# Patient Record
Sex: Male | Born: 1975 | Race: Black or African American | Hispanic: No | Marital: Single | Smoking: Current every day smoker
Health system: Southern US, Community
[De-identification: ages and names within clinical notes are randomized; demographics above are authoritative.]

## PROBLEM LIST (undated history)

## (undated) DIAGNOSIS — I10 Essential (primary) hypertension: Secondary | ICD-10-CM

---

## 1998-04-02 ENCOUNTER — Emergency Department (HOSPITAL_COMMUNITY): Admission: EM | Admit: 1998-04-02 | Discharge: 1998-04-02 | Payer: Self-pay | Admitting: Emergency Medicine

## 1998-04-04 ENCOUNTER — Emergency Department (HOSPITAL_COMMUNITY): Admission: EM | Admit: 1998-04-04 | Discharge: 1998-04-04 | Payer: Self-pay | Admitting: Emergency Medicine

## 1998-04-21 ENCOUNTER — Encounter: Payer: Self-pay | Admitting: Emergency Medicine

## 1998-04-21 ENCOUNTER — Emergency Department (HOSPITAL_COMMUNITY): Admission: EM | Admit: 1998-04-21 | Discharge: 1998-04-21 | Payer: Self-pay | Admitting: Emergency Medicine

## 2004-02-10 ENCOUNTER — Emergency Department (HOSPITAL_COMMUNITY): Admission: EM | Admit: 2004-02-10 | Discharge: 2004-02-10 | Payer: Self-pay | Admitting: Emergency Medicine

## 2006-07-21 ENCOUNTER — Emergency Department (HOSPITAL_COMMUNITY): Admission: EM | Admit: 2006-07-21 | Discharge: 2006-07-21 | Payer: Self-pay | Admitting: *Deleted

## 2010-03-21 ENCOUNTER — Emergency Department (HOSPITAL_COMMUNITY): Payer: Self-pay

## 2010-03-21 ENCOUNTER — Emergency Department (HOSPITAL_COMMUNITY)
Admission: EM | Admit: 2010-03-21 | Discharge: 2010-03-21 | Disposition: A | Discharge status: Home / Self Care | Payer: Self-pay | Attending: Emergency Medicine | Admitting: Emergency Medicine

## 2010-03-21 DIAGNOSIS — R1013 Epigastric pain: Secondary | ICD-10-CM | POA: Insufficient documentation

## 2010-03-21 DIAGNOSIS — IMO0002 Reserved for concepts with insufficient information to code with codable children: Secondary | ICD-10-CM | POA: Insufficient documentation

## 2010-03-21 DIAGNOSIS — R0789 Other chest pain: Secondary | ICD-10-CM | POA: Insufficient documentation

## 2010-03-21 DIAGNOSIS — X58XXXA Exposure to other specified factors, initial encounter: Secondary | ICD-10-CM | POA: Insufficient documentation

## 2010-03-21 DIAGNOSIS — R12 Heartburn: Secondary | ICD-10-CM | POA: Insufficient documentation

## 2010-03-21 LAB — CBC
HCT: 43.5 % (ref 39.0–52.0)
Hemoglobin: 14.8 g/dL (ref 13.0–17.0)
MCH: 25.1 pg — ABNORMAL LOW (ref 26.0–34.0)
MCHC: 34 g/dL (ref 30.0–36.0)
MCV: 73.7 fL — ABNORMAL LOW (ref 78.0–100.0)
Platelets: 237 10*3/uL (ref 150–400)
RBC: 5.9 MIL/uL — ABNORMAL HIGH (ref 4.22–5.81)
RDW: 14.8 % (ref 11.5–15.5)
WBC: 10 10*3/uL (ref 4.0–10.5)

## 2010-03-21 LAB — DIFFERENTIAL
Basophils Absolute: 0.1 10*3/uL (ref 0.0–0.1)
Basophils Relative: 1 % (ref 0–1)
Eosinophils Absolute: 0.3 10*3/uL (ref 0.0–0.7)
Eosinophils Relative: 3 % (ref 0–5)
Lymphocytes Relative: 47 % — ABNORMAL HIGH (ref 12–46)
Lymphs Abs: 4.7 10*3/uL — ABNORMAL HIGH (ref 0.7–4.0)
Monocytes Absolute: 0.9 10*3/uL (ref 0.1–1.0)
Monocytes Relative: 9 % (ref 3–12)
Neutro Abs: 4 10*3/uL (ref 1.7–7.7)
Neutrophils Relative %: 40 % — ABNORMAL LOW (ref 43–77)

## 2010-03-21 LAB — CK TOTAL AND CKMB (NOT AT ARMC)
CK, MB: 1.5 ng/mL (ref 0.3–4.0)
Total CK: 183 U/L (ref 7–232)

## 2010-03-21 LAB — COMPREHENSIVE METABOLIC PANEL
ALT: 31 U/L (ref 0–53)
AST: 21 U/L (ref 0–37)
Calcium: 9.5 mg/dL (ref 8.4–10.5)
GFR calc Af Amer: >60 mL/min (ref >60)
Glucose, Bld: 90 mg/dL (ref 70–99)
Sodium: 140 mEq/L (ref 135–145)
Total Protein: 7.2 g/dL (ref 6.0–8.3)

## 2010-03-21 LAB — LIPASE, BLOOD: Lipase: 28 U/L (ref 11–59)

## 2011-03-09 ENCOUNTER — Encounter (HOSPITAL_COMMUNITY): Payer: Self-pay | Admitting: Emergency Medicine

## 2011-03-09 ENCOUNTER — Emergency Department (HOSPITAL_COMMUNITY)
Admission: EM | Admit: 2011-03-09 | Discharge: 2011-03-09 | Disposition: A | Discharge status: Home / Self Care | Payer: No Typology Code available for payment source | Attending: Emergency Medicine | Admitting: Emergency Medicine

## 2011-03-09 ENCOUNTER — Emergency Department (HOSPITAL_COMMUNITY): Payer: Self-pay

## 2011-03-09 DIAGNOSIS — S335XXA Sprain of ligaments of lumbar spine, initial encounter: Secondary | ICD-10-CM | POA: Insufficient documentation

## 2011-03-09 DIAGNOSIS — M5380 Other specified dorsopathies, site unspecified: Secondary | ICD-10-CM | POA: Insufficient documentation

## 2011-03-09 DIAGNOSIS — S161XXA Strain of muscle, fascia and tendon at neck level, initial encounter: Secondary | ICD-10-CM

## 2011-03-09 DIAGNOSIS — S139XXA Sprain of joints and ligaments of unspecified parts of neck, initial encounter: Secondary | ICD-10-CM | POA: Insufficient documentation

## 2011-03-09 DIAGNOSIS — M545 Low back pain, unspecified: Secondary | ICD-10-CM | POA: Insufficient documentation

## 2011-03-09 DIAGNOSIS — S39012A Strain of muscle, fascia and tendon of lower back, initial encounter: Secondary | ICD-10-CM

## 2011-03-09 DIAGNOSIS — M542 Cervicalgia: Secondary | ICD-10-CM | POA: Insufficient documentation

## 2011-03-09 DIAGNOSIS — M539 Dorsopathy, unspecified: Secondary | ICD-10-CM | POA: Insufficient documentation

## 2011-03-09 DIAGNOSIS — F17200 Nicotine dependence, unspecified, uncomplicated: Secondary | ICD-10-CM | POA: Insufficient documentation

## 2011-03-09 MED ORDER — IBUPROFEN 800 MG PO TABS: 800.0000 mg | ORAL_TABLET | Freq: Three times a day (TID) | ORAL | Status: AC

## 2011-03-09 MED ORDER — HYDROCODONE-ACETAMINOPHEN 5-325 MG PO TABS: 1.0000 | ORAL_TABLET | Freq: Four times a day (QID) | ORAL | Status: AC | PRN

## 2011-03-09 MED ORDER — CYCLOBENZAPRINE HCL 10 MG PO TABS: 10.0000 mg | ORAL_TABLET | Freq: Three times a day (TID) | ORAL | Status: AC | PRN
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
## 2011-03-10 NOTE — ED Provider Notes (Signed)
Medical screening examination/treatment/procedure(s) were performed by non-physician practitioner and as supervising physician I was immediately available for consultation/collaboration.    Yarissa Reining L Kenyotta Dorfman, MD 03/10/11 0855 

## 2012-09-07 ENCOUNTER — Encounter (HOSPITAL_COMMUNITY): Payer: Self-pay | Admitting: Emergency Medicine

## 2012-09-07 ENCOUNTER — Emergency Department (HOSPITAL_COMMUNITY)
Admission: EM | Admit: 2012-09-07 | Discharge: 2012-09-07 | Disposition: A | Discharge status: Home / Self Care | Payer: Self-pay | Attending: Emergency Medicine | Admitting: Emergency Medicine

## 2012-09-07 DIAGNOSIS — Y9389 Activity, other specified: Secondary | ICD-10-CM | POA: Insufficient documentation

## 2012-09-07 DIAGNOSIS — S139XXA Sprain of joints and ligaments of unspecified parts of neck, initial encounter: Secondary | ICD-10-CM | POA: Insufficient documentation

## 2012-09-07 DIAGNOSIS — Y929 Unspecified place or not applicable: Secondary | ICD-10-CM | POA: Insufficient documentation

## 2012-09-07 DIAGNOSIS — X500XXA Overexertion from strenuous movement or load, initial encounter: Secondary | ICD-10-CM | POA: Insufficient documentation

## 2012-09-07 DIAGNOSIS — F17200 Nicotine dependence, unspecified, uncomplicated: Secondary | ICD-10-CM | POA: Insufficient documentation

## 2012-09-07 DIAGNOSIS — M62838 Other muscle spasm: Secondary | ICD-10-CM | POA: Insufficient documentation

## 2012-09-07 MED ORDER — PREDNISONE 20 MG PO TABS: 20.0000 mg | ORAL_TABLET | Freq: Every day | ORAL | Status: DC

## 2012-09-07 MED ORDER — DIAZEPAM 10 MG PO TABS: 10.0000 mg | ORAL_TABLET | Freq: Three times a day (TID) | ORAL | Status: DC
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
## 2012-09-07 NOTE — ED Notes (Signed)
Pt sts on Tuesday he started to get a pain in right posterior shoulder/neck thought he just slept on it wrong but it has progressively gotten worse. sts he has tried Aleve, Ibuprofen, bayer asprin at home but nothing has worked. Pt sts it has made it difficult for him to sleep. Pt has no obvious deformity or swelling. Denies injury to site. Pt in nad, skin warm and dry, resp e/u. Pt able to move neck from left to right, able to walk.

## 2012-09-07 NOTE — ED Provider Notes (Signed)
Carlos Blankenship is a 37 y.o. male who injured his right neck and upper back 6 days ago while pulling on a ratchet mechanism. He's taking Aleve without relief. The pain is worse with right neck rotation and neck extension. He does not have chronic neck pain. There are no symptoms in the right arm.   Exam alert, cooperative. Very minimal tenderness with spasm, right, upper trapezius. Limited right neck rotation, secondary to pain. Neurovascular intact distally in the right arm.  Assessment: Muscle strain, possible radiculopathy, cervical; doubt HNP or significant spinal stenosis. He is amenable to outpatient treatment with anti-inflammatory and muscle.  I saw and evaluated the patient, reviewed the resident's note and I agree with the findings and plan.  Flint Melter, MD 09/07/12 [phone]

## 2013-06-01 ENCOUNTER — Emergency Department (HOSPITAL_COMMUNITY)
Admission: EM | Admit: 2013-06-01 | Discharge: 2013-06-01 | Disposition: A | Discharge status: Home / Self Care | Payer: BC Managed Care – PPO | Attending: Emergency Medicine | Admitting: Emergency Medicine

## 2013-06-01 ENCOUNTER — Encounter (HOSPITAL_COMMUNITY): Payer: Self-pay | Admitting: Emergency Medicine

## 2013-06-01 DIAGNOSIS — K047 Periapical abscess without sinus: Secondary | ICD-10-CM | POA: Insufficient documentation

## 2013-06-01 DIAGNOSIS — IMO0002 Reserved for concepts with insufficient information to code with codable children: Secondary | ICD-10-CM | POA: Insufficient documentation

## 2013-06-01 DIAGNOSIS — F17200 Nicotine dependence, unspecified, uncomplicated: Secondary | ICD-10-CM | POA: Insufficient documentation

## 2013-06-01 DIAGNOSIS — Z79899 Other long term (current) drug therapy: Secondary | ICD-10-CM | POA: Insufficient documentation

## 2013-06-01 DIAGNOSIS — K0889 Other specified disorders of teeth and supporting structures: Secondary | ICD-10-CM

## 2013-06-01 MED ORDER — PENICILLIN V POTASSIUM 500 MG PO TABS: 500.0000 mg | ORAL_TABLET | Freq: Four times a day (QID) | ORAL | Status: DC

## 2013-06-01 MED ORDER — HYDROCODONE-ACETAMINOPHEN 5-325 MG PO TABS: 1.0000 | ORAL_TABLET | Freq: Four times a day (QID) | ORAL | Status: DC | PRN

## 2013-06-01 NOTE — Discharge Instructions (Signed)
Return here as needed.  Followup with the dentist as scheduled.  Rinse with warm water and peroxide 3 times a day

## 2013-06-01 NOTE — ED Notes (Signed)
Pt c/o toothache to right upper and lower teeth onset Friday. Pt has tried ibuprofen, tylenol and ASA. Pt has dental appointment on Tuesday.

## 2013-06-01 NOTE — ED Provider Notes (Signed)
CSN: [id number]     Arrival date & time 06/01/13  1249 History   First MD Initiated Contact with Patient 06/01/13 1428     Chief Complaint  Patient presents with  . Dental Pain     (Consider location/radiation/quality/duration/timing/severity/associated sxs/prior Treatment) HPI Patient presents to the emergency department with dental pain that started last Friday.  Patient, states, that he tried Tylenol and Motrin without relief of his symptoms.  Patient, states, that he called his dentist, who stated to come to the emergency department for pain control and antibiotics.  The dentist will see him on Tuesday afternoon.  She denies nausea, vomiting, fever, headache, swallowing, facial swelling, or syncope.  The patient, states, that nothing seems make his condition worse, but cold water makes the pain, better. patient's symptoms have been persistent since Friday History reviewed. No pertinent past medical history. History reviewed. No pertinent past surgical history. No family history on file. History  Substance Use Topics  . Smoking status: Current Every Day Smoker -- 1.00 packs/day    Types: Cigarettes  . Smokeless tobacco: Not on file  . Alcohol Use: Yes     Comment: occasional    Review of Systems   All other systems negative except as documented in the HPI. All pertinent positives and negatives as reviewed in the HPI. Allergies  Review of patient's allergies indicates no known allergies.  Home Medications   Prior to Admission medications   Medication Sig Start Date End Date Taking? Authorizing Provider  diazepam (VALIUM) 10 MG tablet Take 1 tablet (10 mg total) by mouth 3 (three) times daily. 09/07/12   Kennis CarinaEjiroghene Emokpae, MD  Naproxen Sodium (ALEVE PO) Take 2 capsules by mouth 2 (two) times daily as needed (pain).    Historical Provider, MD  predniSONE (DELTASONE) 20 MG tablet Take 1 tablet (20 mg total) by mouth daily. 09/07/12   Ejiroghene Emokpae, MD   BP 147/80  Pulse 79   Temp(Src) 97.8 F (36.6 C) (Oral)  Resp 18  Ht 6\' 1"  (1.854 m)  Wt 307 lb 8 oz (139.481 kg)  BMI 40.58 kg/m2  SpO2 95% Physical Exam  Constitutional: He appears well-developed and well-nourished. No distress.  HENT:  Head: Normocephalic and atraumatic.  Mouth/Throat:    Cardiovascular: Normal rate, regular rhythm and normal heart sounds.  Exam reveals no gallop and no friction rub.   No murmur heard. Pulmonary/Chest: Effort normal and breath sounds normal. No respiratory distress.    ED Course  Procedures (including critical care time) Patient is advised followup with the dentist as scheduled.  Told to return here as needed.  Told to rinse with warm water and peroxide 3 times a day   Carlyle DollyChristopher W Jenea Dake, PA-C 06/01/13 1436

## 2013-06-02 NOTE — ED Provider Notes (Signed)
Medical screening examination/treatment/procedure(s) were performed by non-physician practitioner and as supervising physician I was immediately available for consultation/collaboration.   EKG Interpretation None        Hiedi Touchton Y. Iden Stripling, MD 06/02/13 1624 
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
## 2017-12-30 ENCOUNTER — Emergency Department (HOSPITAL_COMMUNITY)
Admission: EM | Admit: 2017-12-30 | Discharge: 2017-12-30 | Disposition: A | Discharge status: Home / Self Care | Payer: Self-pay | Attending: Emergency Medicine | Admitting: Emergency Medicine

## 2017-12-30 ENCOUNTER — Encounter (HOSPITAL_COMMUNITY): Payer: Self-pay | Admitting: Emergency Medicine

## 2017-12-30 DIAGNOSIS — F17210 Nicotine dependence, cigarettes, uncomplicated: Secondary | ICD-10-CM | POA: Insufficient documentation

## 2017-12-30 DIAGNOSIS — I10 Essential (primary) hypertension: Secondary | ICD-10-CM | POA: Insufficient documentation

## 2017-12-30 DIAGNOSIS — Z79899 Other long term (current) drug therapy: Secondary | ICD-10-CM | POA: Insufficient documentation

## 2017-12-30 LAB — I-STAT CHEM 8, ED
BUN: 11 mg/dL (ref 6–20)
CREATININE: 1.1 mg/dL (ref 0.61–1.24)
Calcium, Ion: 1.19 mmol/L (ref 1.15–1.40)
Chloride: 106 mmol/L (ref 98–111)
GLUCOSE: 92 mg/dL (ref 70–99)
HCT: 51 % (ref 39.0–52.0)
Hemoglobin: 17.3 g/dL — ABNORMAL HIGH (ref 13.0–17.0)
Potassium: 4 mmol/L (ref 3.5–5.1)
Sodium: 140 mmol/L (ref 135–145)
TCO2: 27 mmol/L (ref 22–32)

## 2017-12-30 MED ORDER — HYDROCHLOROTHIAZIDE 25 MG PO TABS: 25.0000 mg | ORAL_TABLET | Freq: Every day | ORAL | 4 refills | Status: DC

## 2017-12-30 NOTE — ED Notes (Signed)
Patient able to ambulate independently  
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
## 2017-12-30 NOTE — ED Provider Notes (Signed)
[facility]   CSN: [id number] Arrival date & time: 12/30/17  1254     History   Chief Complaint Chief Complaint  Patient presents with  . Hypertension    HPI Carlos Blankenship is a 42 y.o. male.  Patient is a 42 year old male with no known medical problems presenting today because he was having his blood pressure checked at church because they do a clinic every month and they told him he had protein in his urine and high blood pressure and should go to the emergency room immediately.  Patient states he felt fine today.  He rode his bike and was otherwise feeling his normal self.  He has no complaints.  He had a physical done approximately 1 year ago for work and he states at that time his blood pressure was 140/90 and he was cleared for another 2 years.  He takes no medications regularly.  He does smoke cigarettes.  He denies any heavy alcohol use or drugs.  He does not watch his salt intake but states he has been reading a lot about hypertension since she told him and he plans on changing his diet and lifestyle.   Hypertension  This is a new problem. Episode onset: found out today. The problem occurs constantly. Progression since onset: unknown. Pertinent negatives include no chest pain, no headaches and no shortness of breath. Nothing aggravates the symptoms. Nothing relieves the symptoms.    History reviewed. No pertinent past medical history.  There are no active problems to display for this patient.   History reviewed. No pertinent surgical history.      Home Medications    Prior to Admission medications   Medication Sig Start Date End Date Taking? Authorizing Provider  diazepam (VALIUM) 10 MG tablet Take 1 tablet (10 mg total) by mouth 3 (three) times daily. 09/07/12   Emokpae, Ejiroghene E, MD  HYDROcodone-acetaminophen (NORCO/VICODIN) 5-325 MG per tablet Take 1 tablet by mouth every 6 (six) hours as needed for  moderate pain. 06/01/13   Lawyer, Cristal Deerhristopher, PA-C  Naproxen Sodium (ALEVE PO) Take 2 capsules by mouth 2 (two) times daily as needed (pain).    [provider]  penicillin v potassium (VEETID) 500 MG tablet Take 1 tablet (500 mg total) by mouth 4 (four) times daily. 06/01/13   Lawyer, Cristal Deerhristopher, PA-C  predniSONE (DELTASONE) 20 MG tablet Take 1 tablet (20 mg total) by mouth daily. 09/07/12   Onnie BoerEmokpae, Ejiroghene E, MD    Family History History reviewed. No pertinent family history.  Social History Social History   Tobacco Use  . Smoking status: Current Every Day Smoker    Packs/day: 1.00    Types: Cigarettes  . Smokeless tobacco: Never Used  Substance Use Topics  . Alcohol use: Yes    Comment: occasional  . Drug use: No    Types: Marijuana     Allergies   Patient has no known allergies.   Review of Systems Review of Systems  Respiratory: Negative for shortness of breath.   Cardiovascular: Negative for chest pain.  Neurological: Negative for headaches.  All other systems reviewed and are negative.    Physical Exam Updated Vital Signs BP (!) 206/129 (BP Location: Right Wrist)   Pulse 89   Temp 97.9 F (36.6 C) (Oral)   Resp (!) 22   SpO2 100%   Physical Exam  Constitutional: He is oriented to person, place, and time. He appears well-developed and well-nourished. No distress.  overweight  HENT:  Head: Normocephalic and atraumatic.  Mouth/Throat: Oropharynx is clear and moist.  Eyes: Pupils are equal, round, and reactive to light. Conjunctivae and EOM are normal.  Neck: Normal range of motion. Neck supple.  Cardiovascular: Normal rate, regular rhythm and intact distal pulses.  No murmur heard. Pulmonary/Chest: Effort normal and breath sounds normal. No respiratory distress. He has no wheezes. He has no rales.  Abdominal: Soft. He exhibits no distension. There is no tenderness. There is no rebound and no guarding.  Musculoskeletal: Normal range of motion. He  exhibits no edema or tenderness.  Neurological: He is alert and oriented to person, place, and time.  Skin: Skin is warm and dry. No rash noted. No erythema.  Psychiatric: He has a normal mood and affect. His behavior is normal.  Nursing note and vitals reviewed.    ED Treatments / Results  Labs (all labs ordered are listed, but only abnormal results are displayed) Labs Reviewed  I-STAT CHEM 8, ED    EKG None  Radiology No results found.  Procedures Procedures (including critical care time)  Medications Ordered in ED Medications - No data to display   Initial Impression / Assessment and Plan / ED Course  I have reviewed the triage vital signs and the nursing notes.  Pertinent labs & imaging results that were available during my care of the patient were reviewed by me and considered in my medical decision making (see chart for details).     Patient is an asymptomatic 42 year old male with new onset hypertension.  Last year patient's blood pressure was 140 over 90s but at that time did not receive any treatment.  Discussed with patient lifestyle and diet modification.  CMP done to evaluate renal function.  Blood pressure here is between 180s-200/118-130.  Will start on HCTZ and patient will be receiving his insurance in 1 month and will follow up with PCP then.  2:05 PM Cr is 1.1.  Will start on hctz and have pt f/u with pcp. Final Clinical Impressions(s) / ED Diagnoses   Final diagnoses:  Hypertension, unspecified type    ED Discharge Orders         Ordered    hydrochlorothiazide (HYDRODIURIL) 25 MG tablet  Daily     12/30/17 1412           Gwyneth Sprout, MD 12/30/17 1412

## 2019-01-13 ENCOUNTER — Other Ambulatory Visit: Payer: Self-pay

## 2019-01-13 ENCOUNTER — Emergency Department (HOSPITAL_COMMUNITY)
Admission: EM | Admit: 2019-01-13 | Discharge: 2019-01-13 | Disposition: A | Discharge status: Home / Self Care | Payer: Self-pay | Attending: Emergency Medicine | Admitting: Emergency Medicine

## 2019-01-13 ENCOUNTER — Encounter (HOSPITAL_COMMUNITY): Payer: Self-pay

## 2019-01-13 DIAGNOSIS — F17210 Nicotine dependence, cigarettes, uncomplicated: Secondary | ICD-10-CM | POA: Insufficient documentation

## 2019-01-13 DIAGNOSIS — Z79899 Other long term (current) drug therapy: Secondary | ICD-10-CM | POA: Insufficient documentation

## 2019-01-13 DIAGNOSIS — I10 Essential (primary) hypertension: Secondary | ICD-10-CM | POA: Insufficient documentation

## 2019-01-13 MED ORDER — HYDROCHLOROTHIAZIDE 25 MG PO TABS: 25.0000 mg | ORAL_TABLET | Freq: Every day | ORAL | 2 refills | Status: DC

## 2019-01-13 NOTE — Discharge Instructions (Signed)
Please return for any problemd. Follow-up with a regular care provider as instructed.

## 2019-01-13 NOTE — ED Triage Notes (Signed)
Pt states BP at work was 200/100. Pt states that the work dr sent him here for eval. Pt states that he used to take HCTZ but hasn't for a while.

## 2019-01-13 NOTE — ED Notes (Signed)
Patient reports he is just here for high BP and does not want bloodwork done at this time.

## 2019-01-13 NOTE — ED Notes (Signed)
Pt reports he does not need blood as he is just here to get BP meds. States he was on HCTZ and stopped taking it because he ran out of his script and it caused him to feel constipated

## 2019-01-13 NOTE — ED Provider Notes (Signed)
[facility]-EMERGENCY DEPT Provider Note   CSN: [id number] Arrival date & time: 01/13/19  1248     History Chief Complaint  Patient presents with  . Hypertension    Carlos Blankenship is a 43 y.o. male.  43 year old male with primary clusters detailed blood presents for evaluation of his hypertension. Patient reports longstanding history of hypertension. Patient reports that he stopped taking hydrochlorothiazide several months ago when he ran out of refills. Patient reports that today he had a regular blood pressure check at his place of employment. His blood pressure was elevated. He is otherwise asymptomatic. He denies associated chest pain, shortness breath, nausea, vomiting, headache, visual change, or other complaint.  He does request refill on his HCTZ.  The history is provided by the patient and medical records.  Hypertension This is a chronic problem. The current episode started more than 1 week ago. The problem occurs constantly. The problem has not changed since onset.Pertinent negatives include no chest pain, no abdominal pain, no headaches and no shortness of breath. Nothing aggravates the symptoms. Nothing relieves the symptoms.       History reviewed. No pertinent past medical history.  There are no problems to display for this patient.   History reviewed. No pertinent surgical history.     No family history on file.  Social History   Tobacco Use  . Smoking status: Current Every Day Smoker    Packs/day: 1.00    Types: Cigarettes  . Smokeless tobacco: Never Used  Substance Use Topics  . Alcohol use: Yes    Comment: occasional  . Drug use: No    Types: Marijuana    Home Medications Prior to Admission medications   Medication Sig Start Date End Date Taking? Authorizing Provider  diazepam (VALIUM) 10 MG tablet Take 1 tablet (10 mg total) by mouth 3 (three) times daily. 09/07/12   Emokpae, Ejiroghene E, MD  hydrochlorothiazide  (HYDRODIURIL) 25 MG tablet Take 1 tablet (25 mg total) by mouth daily. 12/30/17   Gwyneth Sprout, MD  hydrochlorothiazide (HYDRODIURIL) 25 MG tablet Take 1 tablet (25 mg total) by mouth daily. 01/13/19   Wynetta Fines, MD  HYDROcodone-acetaminophen (NORCO/VICODIN) 5-325 MG per tablet Take 1 tablet by mouth every 6 (six) hours as needed for moderate pain. 06/01/13   Lawyer, Cristal Deer, PA-C  Naproxen Sodium (ALEVE PO) Take 2 capsules by mouth 2 (two) times daily as needed (pain).    [provider]  penicillin v potassium (VEETID) 500 MG tablet Take 1 tablet (500 mg total) by mouth 4 (four) times daily. 06/01/13   Lawyer, Cristal Deer, PA-C  predniSONE (DELTASONE) 20 MG tablet Take 1 tablet (20 mg total) by mouth daily. 09/07/12   Onnie Boer, MD    Allergies    Patient has no known allergies.  Review of Systems   Review of Systems  Respiratory: Negative for shortness of breath.   Cardiovascular: Negative for chest pain.  Gastrointestinal: Negative for abdominal pain.  Neurological: Negative for headaches.  All other systems reviewed and are negative.   Physical Exam Updated Vital Signs BP (!) 169/129 (BP Location: Right Arm)   Pulse 82   Temp 98.8 F (37.1 C)   Resp 16   SpO2 100%   Physical Exam Vitals and nursing note reviewed.  Constitutional:      General: He is not in acute distress.    Appearance: Normal appearance. He is well-developed.  HENT:     Head: Normocephalic and atraumatic.  Eyes:     Conjunctiva/sclera: Conjunctivae normal.     Pupils: Pupils are equal, round, and reactive to light.  Cardiovascular:     Rate and Rhythm: Normal rate and regular rhythm.     Heart sounds: Normal heart sounds.  Pulmonary:     Effort: Pulmonary effort is normal. No respiratory distress.     Breath sounds: Normal breath sounds.  Abdominal:     General: There is no distension.     Palpations: Abdomen is soft.     Tenderness: There is no abdominal tenderness.   Musculoskeletal:        General: No deformity. Normal range of motion.     Cervical back: Normal range of motion and neck supple.  Skin:    General: Skin is warm and dry.  Neurological:     General: No focal deficit present.     Mental Status: He is alert and oriented to person, place, and time. Mental status is at baseline.     Cranial Nerves: No cranial nerve deficit.     Sensory: No sensory deficit.     Motor: No weakness.     Coordination: Coordination normal.     ED Results / Procedures / Treatments   Labs (all labs ordered are listed, but only abnormal results are displayed) Labs Reviewed  CBC  BASIC METABOLIC PANEL  TROPONIN I (HIGH SENSITIVITY)  TROPONIN I (HIGH SENSITIVITY)    EKG None  Radiology No results found.  Procedures Procedures (including critical care time)  Medications Ordered in ED Medications - No data to display  ED Course  I have reviewed the triage vital signs and the nursing notes.  Pertinent labs & imaging results that were available during my care of the patient were reviewed by me and considered in my medical decision making (see chart for details).    MDM Rules/Calculators/A&P                      MDM  Screen complete  Carlos Blankenship was evaluated in Emergency Department on 01/13/2019 for the symptoms described in the history of present illness. He was evaluated in the context of the global COVID-19 pandemic, which necessitated consideration that the patient might be at risk for infection with the SARS-CoV-2 virus that causes COVID-19. Institutional protocols and algorithms that pertain to the evaluation of patients at risk for COVID-19 are in a state of rapid change based on information released by regulatory bodies including the CDC and federal and state organizations. These policies and algorithms were followed during the patient's care in the ED.   Patient is presenting for refill request on his HCTZ. Patient with  asymptomatic elevated BP.  HCTZ will be refilled. Patient strongly encouraged to follow-up with his regular care provider soon as possible. Importance of close follow-up is stressed.   Strict return precautions given understood. Final Clinical Impression(s) / ED Diagnoses Final diagnoses:  Hypertension, unspecified type    Rx / DC Orders ED Discharge Orders         Ordered    hydrochlorothiazide (HYDRODIURIL) 25 MG tablet  Daily     01/13/19 1653           Valarie Merino, MD 01/13/19 1655

## 2019-05-03 ENCOUNTER — Ambulatory Visit: Payer: Self-pay | Attending: Internal Medicine

## 2019-05-03 DIAGNOSIS — Z23 Encounter for immunization: Secondary | ICD-10-CM

## 2019-05-03 NOTE — Progress Notes (Signed)
   Covid-19 Vaccination Clinic  Name:  DAJOUR PIERPOINT    MRN: [id number] DOB: 1975-03-18  05/03/2019  Mr. Crissman was observed post Covid-19 immunization for 15 minutes without incident. He was provided with Vaccine Information Sheet and instruction to access the V-Safe system.   Mr. Ayer was instructed to call 911 with any severe reactions post vaccine: Marland Kitchen Difficulty breathing  . Swelling of face and throat  . A fast heartbeat  . A bad rash all over body  . Dizziness and weakness   Immunizations Administered    Name Date Dose VIS Date Route   Pfizer COVID-19 Vaccine 05/03/2019  3:30 PM 0.3 mL 01/10/2019 Intramuscular   Manufacturer: [employer], [employer]   Lot: [id number]   NDC: [address]-4

## 2019-05-27 ENCOUNTER — Ambulatory Visit: Payer: Self-pay | Attending: Internal Medicine

## 2019-05-27 DIAGNOSIS — Z23 Encounter for immunization: Secondary | ICD-10-CM

## 2019-05-27 NOTE — Progress Notes (Signed)
   Covid-19 Vaccination Clinic  Name:  Carlos Blankenship    MRN: [id number] DOB: 1976-01-12  05/27/2019  Mr. Carlos Blankenship was observed post Covid-19 immunization for 15 minutes without incident. He was provided with Vaccine Information Sheet and instruction to access the V-Safe system.   Mr. Carlos Blankenship was instructed to call 911 with any severe reactions post vaccine: Marland Kitchen Difficulty breathing  . Swelling of face and throat  . A fast heartbeat  . A bad rash all over body  . Dizziness and weakness   Immunizations Administered    Name Date Dose VIS Date Route   Pfizer COVID-19 Vaccine 05/27/2019 10:55 AM 0.3 mL 03/26/2018 Intramuscular   Manufacturer: [employer], [employer]   Lot: [id number]   NDC: [address]-2

## 2020-11-21 ENCOUNTER — Encounter: Payer: Self-pay | Admitting: Emergency Medicine

## 2020-11-21 ENCOUNTER — Other Ambulatory Visit: Payer: Self-pay

## 2020-11-21 ENCOUNTER — Ambulatory Visit
Admission: EM | Admit: 2020-11-21 | Discharge: 2020-11-21 | Disposition: A | Discharge status: Home / Self Care | Payer: Self-pay | Attending: Internal Medicine | Admitting: Internal Medicine

## 2020-11-21 DIAGNOSIS — R030 Elevated blood-pressure reading, without diagnosis of hypertension: Secondary | ICD-10-CM

## 2020-11-21 DIAGNOSIS — K047 Periapical abscess without sinus: Secondary | ICD-10-CM

## 2020-11-21 HISTORY — DX: Essential (primary) hypertension: I10

## 2020-11-21 MED ORDER — CLINDAMYCIN HCL 150 MG PO CAPS: 300.0000 mg | ORAL_CAPSULE | Freq: Three times a day (TID) | ORAL | 0 refills | Status: AC

## 2020-11-21 MED ORDER — LIDOCAINE VISCOUS HCL 2 % MT SOLN: 15.0000 mL | OROMUCOSAL | 0 refills | Status: DC | PRN

## 2020-11-21 MED ORDER — IBUPROFEN 600 MG PO TABS: 600.0000 mg | ORAL_TABLET | Freq: Four times a day (QID) | ORAL | 0 refills | Status: DC | PRN

## 2020-11-21 MED ORDER — AMLODIPINE BESYLATE 5 MG PO TABS: 10.0000 mg | ORAL_TABLET | Freq: Every day | ORAL | 0 refills | Status: DC

## 2020-11-21 NOTE — Discharge Instructions (Signed)
You have an infection of your mouth.  You have been prescribed clindamycin antibiotic to treat this.  Please take with food.  You have also prescribed lidocaine that you may apply to the mouth to help numb.  Ibuprofen has also been prescribed to help with pain and inflammation.  Please do not take any additional ibuprofen, Advil, Aleve while taking this ibuprofen.  I have refilled your amlodipine blood pressure medication.  Please start taking this as soon as you get it.  Please monitor blood pressure at least twice daily.  Primary care assistance has been requested for you.  Someone from [facility] will reach out to you to set up an appointment for blood pressure management.

## 2020-11-21 NOTE — ED Provider Notes (Signed)
[facility]    CSN: [id number] Arrival date & time: 11/21/20  0846      History   Chief Complaint Chief Complaint  Patient presents with   Dental Pain    HPI Carlos Blankenship is a 45 y.o. male.   Patient presents with tooth pain and mouth pain that has been present for a few days.  Patient reports that he has a dentist appointment next Friday for further evaluation.  Pain has become unbearable for patient.  Denies any purulent drainage or fevers.   Patient also presents with possible elevated blood pressure reading.  Patient reports that he has his blood pressure checked at church routinely.  Blood pressure at church was in the 200s systolic per patient.  Patient was advised by nurse at church to be evaluated at the ED.  Patient reports that he used to take blood pressure medications but has not taken any medications for at least 2 to 3 months.  Last PCP visit was approximately 6 months ago.  Patient is not sure of the blood pressure medication names of the dosages that he took.  Patient reports that his blood pressure is typically 120 systolic while taking blood pressure medication but is typically 130s to 150s systolic while not on blood pressure medication.  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  Currently denies any headache, chest pain, shortness of breath, blurred vision, nausea, vomiting or dizziness.   Dental Pain  Past Medical History:  Diagnosis Date   Hypertension     There are no problems to display for this patient.   History reviewed. No pertinent surgical history.     Home Medications    Prior to Admission medications   Medication Sig Start Date End Date Taking? Authorizing Provider  amLODipine (NORVASC) 5 MG tablet Take 2 tablets (10 mg total) by mouth daily. 11/21/20  Yes Kory Panjwani, Acie Fredrickson, FNP  clindamycin (CLEOCIN) 150 MG capsule Take 2 capsules (300 mg total) by mouth 3 (three) times daily for 10 days. 11/21/20  12/01/20 Yes Wardell Pokorski, Acie Fredrickson, FNP  ibuprofen (ADVIL) 600 MG tablet Take 1 tablet (600 mg total) by mouth every 6 (six) hours as needed for mild pain. 11/21/20  Yes Zulma Court, Rolly Salter E, FNP  lidocaine (XYLOCAINE) 2 % solution Use as directed 15 mLs in the mouth or throat as needed for mouth pain. You may apply lidocaine to affected area as well. 11/21/20  Yes Valora Norell, Rolly Salter E, FNP  diazepam (VALIUM) 10 MG tablet Take 1 tablet (10 mg total) by mouth 3 (three) times daily. 09/07/12   Emokpae, Ejiroghene E, MD  hydrochlorothiazide (HYDRODIURIL) 25 MG tablet Take 1 tablet (25 mg total) by mouth daily. 12/30/17   Gwyneth Sprout, MD  hydrochlorothiazide (HYDRODIURIL) 25 MG tablet Take 1 tablet (25 mg total) by mouth daily. 01/13/19   Wynetta Fines, MD  HYDROcodone-acetaminophen (NORCO/VICODIN) 5-325 MG per tablet Take 1 tablet by mouth every 6 (six) hours as needed for moderate pain. 06/01/13   Lawyer, Cristal Deer, PA-C  Naproxen Sodium (ALEVE PO) Take 2 capsules by mouth 2 (two) times daily as needed (pain).    [provider]  penicillin v potassium (VEETID) 500 MG tablet Take 1 tablet (500 mg total) by mouth 4 (four) times daily. 06/01/13   Lawyer, Cristal Deer, PA-C  predniSONE (DELTASONE) 20 MG tablet Take 1 tablet (20 mg total) by mouth daily. 09/07/12   Onnie Boer, MD    Family History History reviewed. No  pertinent family history.  Social History Social History   Tobacco Use   Smoking status: Every Day    Packs/day: 1.00    Types: Cigarettes   Smokeless tobacco: Never  Substance Use Topics   Alcohol use: Yes    Comment: occasional   Drug use: No    Types: Marijuana     Allergies   Patient has no known allergies.   Review of Systems Review of Systems Per HPI  Physical Exam Triage Vital Signs ED Triage Vitals  Enc Vitals Group     BP 11/21/20 0957 (!) 166/93     Pulse Rate 11/21/20 0957 73     Resp 11/21/20 0957 16     Temp 11/21/20 0957 97.9 F (36.6 C)     Temp  Source 11/21/20 0957 Oral     SpO2 11/21/20 0957 96 %     Weight --      Height --      Head Circumference --      Peak Flow --      Pain Score 11/21/20 0958 10     Pain Loc --      Pain Edu? --      Excl. in GC? --    No data found.  Updated Vital Signs BP (!) 188/105 Comment: BP rechecked by the provider  Pulse 73   Temp 97.9 F (36.6 C) (Oral)   Resp 16   SpO2 96%   Visual Acuity Right Eye Distance:   Left Eye Distance:   Bilateral Distance:    Right Eye Near:   Left Eye Near:    Bilateral Near:     Physical Exam Constitutional:      General: He is not in acute distress.    Appearance: Normal appearance. He is not toxic-appearing.  HENT:     Head: Normocephalic and atraumatic.     Mouth/Throat:     Lips: Pink.     Mouth: Mucous membranes are moist.     Dentition: Abnormal dentition. Does not have dentures. Dental tenderness, gingival swelling and dental abscesses present. No dental caries or gum lesions.     Pharynx: Oropharynx is clear. No pharyngeal swelling, oropharyngeal exudate or posterior oropharyngeal erythema.     Comments: Gingival swelling noted to right cheek of mouth that extends into right lower dentition.  No drainage noted.  Mild erythema noted. Eyes:     Extraocular Movements: Extraocular movements intact.     Conjunctiva/sclera: Conjunctivae normal.  Cardiovascular:     Rate and Rhythm: Normal rate and regular rhythm.     Pulses: Normal pulses.     Heart sounds: Normal heart sounds.  Pulmonary:     Effort: Pulmonary effort is normal. No respiratory distress.     Breath sounds: Normal breath sounds.  Skin:    General: Skin is warm and dry.  Neurological:     General: No focal deficit present.     Mental Status: He is alert and oriented to person, place, and time. Mental status is at baseline.     Cranial Nerves: Cranial nerves 2-12 are intact.     Sensory: Sensation is intact.     Motor: Motor function is intact.     Coordination:  Coordination is intact.     Gait: Gait is intact.  Psychiatric:        Mood and Affect: Mood normal.        Behavior: Behavior normal.        Thought Content:  Thought content normal.        Judgment: Judgment normal.     UC Treatments / Results  Labs (all labs ordered are listed, but only abnormal results are displayed) Labs Reviewed - No data to display  EKG   Radiology No results found.  Procedures Procedures (including critical care time)  Medications Ordered in UC Medications - No data to display  Initial Impression / Assessment and Plan / UC Course  I have reviewed the triage vital signs and the nursing notes.  Pertinent labs & imaging results that were available during my care of the patient were reviewed by me and considered in my medical decision making (see chart for details).     Will treat dental abscess with clindamycin.  Lidocaine solution also prescribed as well as ibuprofen to decrease pain and inflammation.  Advised patient to follow-up with dentist as soon as possible.  Suspect that elevated blood pressure is related to pain, although recheck on blood pressure was slightly higher than initial triage.  His pharmacy was called to verify medications.  Pharmacy noted that patient used to take 25 mg hydrochlorothiazide, 50 mg losartan, and 10 mg amlodipine daily.  Although, patient has not taken these medications or picked them up from pharmacy since April 2022.  Will restart amlodipine only due to elevated blood pressure today.  Do not want to restart all 3 medications at the same time given duration of time the patient has not been on medications.  I also think that pain is contributing to high blood pressure.  No signs of endorgan damage and neuro exam is normal.  No signs of hypertensive urgency.  PCP assistance requested for patient.  Advised patient to monitor blood pressure at home at least twice daily.  Patient to follow-up with PCP for further evaluation of  blood pressure. Discussed strict return precautions. Patient verbalized understanding and is agreeable with plan.  Final Clinical Impressions(s) / UC Diagnoses   Final diagnoses:  Dental infection  Elevated blood pressure reading     Discharge Instructions      You have an infection of your mouth.  You have been prescribed clindamycin antibiotic to treat this.  Please take with food.  You have also prescribed lidocaine that you may apply to the mouth to help numb.  Ibuprofen has also been prescribed to help with pain and inflammation.  Please do not take any additional ibuprofen, Advil, Aleve while taking this ibuprofen.  I have refilled your amlodipine blood pressure medication.  Please start taking this as soon as you get it.  Please monitor blood pressure at least twice daily.  Primary care assistance has been requested for you.  Someone from [facility] health will reach out to you to set up an appointment for blood pressure management.     ED Prescriptions     Medication Sig Dispense Auth. Provider   amLODipine (NORVASC) 5 MG tablet Take 2 tablets (10 mg total) by mouth daily. 90 tablet [place], [place] E, [place]   ibuprofen (ADVIL) 600 MG tablet Take 1 tablet (600 mg total) by mouth every 6 (six) hours as needed for mild pain. 30 tablet [place], [place] E, [place]   lidocaine (XYLOCAINE) 2 % solution Use as directed 15 mLs in the mouth or throat as needed for mouth pain. You may apply lidocaine to affected area as well. 100 mL Garren Greenman, Rolly Salter E, [place]   clindamycin (CLEOCIN) 150 MG capsule Take 2 capsules (300 mg total) by mouth 3 (three) times  daily for 10 days. 60 capsule [place], Acie Fredrickson, [place]      PDMP not reviewed this encounter.   Gustavus Bryant, [place] 11/21/20 [phone]

## 2020-11-21 NOTE — ED Triage Notes (Signed)
Had BP checked at church and was told to go to ED. BP 166/93  Has been trying to get into dentist office but can't be seen until Friday, has bad toothache

## 2022-07-18 ENCOUNTER — Encounter (HOSPITAL_COMMUNITY): Payer: Self-pay

## 2022-07-18 ENCOUNTER — Emergency Department (HOSPITAL_COMMUNITY)
Admission: EM | Admit: 2022-07-18 | Discharge: 2022-07-18 | Disposition: A | Discharge status: Home / Self Care | Payer: Medicaid Other | Attending: Emergency Medicine | Admitting: Emergency Medicine

## 2022-07-18 ENCOUNTER — Other Ambulatory Visit: Payer: Self-pay

## 2022-07-18 ENCOUNTER — Emergency Department (HOSPITAL_COMMUNITY): Payer: Medicaid Other

## 2022-07-18 DIAGNOSIS — R110 Nausea: Secondary | ICD-10-CM | POA: Insufficient documentation

## 2022-07-18 DIAGNOSIS — K859 Acute pancreatitis without necrosis or infection, unspecified: Secondary | ICD-10-CM | POA: Insufficient documentation

## 2022-07-18 DIAGNOSIS — R8290 Unspecified abnormal findings in urine: Secondary | ICD-10-CM

## 2022-07-18 DIAGNOSIS — R319 Hematuria, unspecified: Secondary | ICD-10-CM | POA: Insufficient documentation

## 2022-07-18 DIAGNOSIS — R109 Unspecified abdominal pain: Secondary | ICD-10-CM | POA: Diagnosis present

## 2022-07-18 DIAGNOSIS — R9431 Abnormal electrocardiogram [ECG] [EKG]: Secondary | ICD-10-CM | POA: Insufficient documentation

## 2022-07-18 LAB — LIPID PANEL
Cholesterol: 177 mg/dL (ref 0–200)
HDL: 59 mg/dL
LDL Cholesterol: 98 mg/dL (ref 0–99)
Total CHOL/HDL Ratio: 3 ratio
Triglycerides: 102 mg/dL
VLDL: 20 mg/dL (ref 0–40)

## 2022-07-18 LAB — URINALYSIS, ROUTINE W REFLEX MICROSCOPIC
Bilirubin Urine: NEGATIVE
Glucose, UA: NEGATIVE mg/dL
Ketones, ur: NEGATIVE mg/dL
Leukocytes,Ua: NEGATIVE
Nitrite: NEGATIVE
Protein, ur: 30 mg/dL — AB
Specific Gravity, Urine: 1.024 (ref 1.005–1.030)
pH: 6 (ref 5.0–8.0)

## 2022-07-18 LAB — COMPREHENSIVE METABOLIC PANEL
ALT: 23 U/L (ref 0–44)
AST: 21 U/L (ref 15–41)
Albumin: 3.7 g/dL (ref 3.5–5.0)
Alkaline Phosphatase: 75 U/L (ref 38–126)
Anion gap: 10 (ref 5–15)
BUN: 17 mg/dL (ref 6–20)
CO2: 24 mmol/L (ref 22–32)
Calcium: 9 mg/dL (ref 8.9–10.3)
Chloride: 105 mmol/L (ref 98–111)
Creatinine, Ser: 0.93 mg/dL (ref 0.61–1.24)
GFR, Estimated: >60 mL/min (ref >60)
Glucose, Bld: 131 mg/dL — ABNORMAL HIGH (ref 70–99)
Potassium: 3.4 mmol/L — ABNORMAL LOW (ref 3.5–5.1)
Sodium: 139 mmol/L (ref 135–145)
Total Bilirubin: 0.7 mg/dL (ref 0.3–1.2)
Total Protein: 6.7 g/dL (ref 6.5–8.1)

## 2022-07-18 LAB — CBC
HCT: 44.2 % (ref 39.0–52.0)
Hemoglobin: 14.1 g/dL (ref 13.0–17.0)
MCH: 23.7 pg — ABNORMAL LOW (ref 26.0–34.0)
MCHC: 31.9 g/dL (ref 30.0–36.0)
MCV: 74.2 fL — ABNORMAL LOW (ref 80.0–100.0)
Platelets: 236 10*3/uL (ref 150–400)
RBC: 5.96 MIL/uL — ABNORMAL HIGH (ref 4.22–5.81)
RDW: 15.5 % (ref 11.5–15.5)
WBC: 7.9 10*3/uL (ref 4.0–10.5)
nRBC: 0 % (ref 0.0–0.2)

## 2022-07-18 LAB — TROPONIN I (HIGH SENSITIVITY): Troponin I (High Sensitivity): 14 ng/L (ref <18)

## 2022-07-18 LAB — LIPASE, BLOOD: Lipase: 60 U/L — ABNORMAL HIGH (ref 11–51)

## 2022-07-18 MED ORDER — OXYCODONE-ACETAMINOPHEN 5-325 MG PO TABS
2.0000 | ORAL_TABLET | Freq: Once | ORAL | Status: AC
  Administered 2022-07-18: 2 via ORAL
  Filled 2022-07-18: qty 2

## 2022-07-18 MED ORDER — ONDANSETRON 4 MG PO TBDP: 4.0000 mg | ORAL_TABLET | Freq: Three times a day (TID) | ORAL | 0 refills | Status: DC | PRN

## 2022-07-18 MED ORDER — ONDANSETRON HCL 4 MG/2ML IJ SOLN
4.0000 mg | Freq: Once | INTRAMUSCULAR | Status: AC
  Administered 2022-07-18: 4 mg via INTRAVENOUS
  Filled 2022-07-18: qty 2

## 2022-07-18 MED ORDER — HYDROCHLOROTHIAZIDE 25 MG PO TABS
25.0000 mg | ORAL_TABLET | Freq: Every day | ORAL | Status: DC
  Administered 2022-07-18: 25 mg via ORAL
  Filled 2022-07-18: qty 1

## 2022-07-18 MED ORDER — OXYCODONE-ACETAMINOPHEN 5-325 MG PO TABS: 1.0000 | ORAL_TABLET | Freq: Four times a day (QID) | ORAL | 0 refills | Status: DC | PRN

## 2022-07-18 MED ORDER — AMLODIPINE BESYLATE 5 MG PO TABS
10.0000 mg | ORAL_TABLET | Freq: Once | ORAL | Status: AC
  Administered 2022-07-18: 10 mg via ORAL
  Filled 2022-07-18: qty 2

## 2022-07-18 MED ORDER — KETOROLAC TROMETHAMINE 30 MG/ML IJ SOLN
30.0000 mg | Freq: Once | INTRAMUSCULAR | Status: AC
  Administered 2022-07-18: 30 mg via INTRAVENOUS
  Filled 2022-07-18: qty 1

## 2022-07-18 NOTE — ED Provider Notes (Signed)
I provided a substantive portion of the care of this patient.  I personally made/approved the management plan for this patient and take responsibility for the patient management.   Here with left sided abdominal pain starting earlier tonight with nausea, no emesis. No dyspnea, diaphoresis, lightheadedness. Radiates towards side a bit.  Smokes since he was in his 47's. Drank some alcohol Saturday but not since then. No biliary history. On BP meds at home.  Exam reassuring. Vitals notable for hypertension. Labs and ct notable for mild pancreatitis.  ECG is abnormal. May be related to developing LBBB/LVH with repol changes however no old one to compare and is having pain on left. Will check troponin. Will also add on lipid panel and a couple home antihypertensives. Anticipate that if his trops and triglycerides are reasonable can be d/c on symptomatic tx w/ pcp/GI follow for etiology of the pancreatitis. otherwise discussion with cards/GI/IM here as appropriate.   EKG Interpretation  Date/Time:  Tuesday July 18 2022 04:55:48 EDT Ventricular Rate:  68 PR Interval:  142 QRS Duration: 107 QT Interval:  383 QTC Calculation: 408 R Axis:   -60 Text Interpretation: Sinus rhythm Left anterior fascicular block Repol abnrm suggests ischemia, inferior leads Minimal ST elevation, anterolateral leads ST elevation in multiple leads including I, aVL and V2 ST changes in inferior leads and v6 no comparison Confirmed by Marily Memos [id number]) on 07/18/2022 5:57:11 AM    Abbagayle Zaragoza, Barbara Cower, MD 07/18/22 [phone]

## 2022-07-18 NOTE — Discharge Instructions (Addendum)
Please follow-up with the cardiologist.  Their office should contact you.  You need to be seen by a primary care doctor.  They will need to recheck your urine because there was blood in it tonight. If you have not heard from the Cardiology office within the next 72 hours please call [phone].

## 2022-07-18 NOTE — ED Triage Notes (Signed)
Pt arrived from home via POV c/o abd pain LUQ radiating to left flank area. Pain started approx 2000 and has not resolved.

## 2022-07-18 NOTE — ED Provider Notes (Signed)
Care of patient handed off to me at change of shift by Ivar Drape, PA-C. Please see his note for work up. Briefly, 47 year old male who presents to the emergency department with left-sided abdominal pain that began at 8pm. Associated with nausea and more concentrated urine.   Mild pancreatitis - lipase is 60.  Physical Exam  BP (!) 204/130 (BP Location: Left Arm)   Pulse 60   Temp 97.8 F (36.6 C) (Oral)   Resp 17   Ht 6\' 1"  (1.854 m)   Wt (!) 140 kg   SpO2 93%   BMI 40.72 kg/m   Physical Exam Vitals and nursing note reviewed.  HENT:     Head: Normocephalic and atraumatic.  Eyes:     General: No scleral icterus. Pulmonary:     Effort: Pulmonary effort is normal. No respiratory distress.  Skin:    Findings: No rash.  Neurological:     General: No focal deficit present.     Mental Status: He is alert.  Psychiatric:        Mood and Affect: Mood normal.        Behavior: Behavior normal.        Thought Content: Thought content normal.        Judgment: Judgment normal.     Procedures  Procedures  ED Course / MDM    Medical Decision Making Amount and/or Complexity of Data Reviewed Labs: ordered. Radiology: ordered.  Risk Prescription drug management.   Patient had labs and renal stone study based on symptoms and hematuria. CT showed  IMPRESSION:  1. Suspect mild acute edematous pancreatitis.  2. No hydronephrosis or nephrolithiasis.   His lipase returned only mildly elevated at 60. Last drink was over the weekend. No chronic alcohol abuse/daily drinking. He also had EKG performed which showed ST elevation in multiple leads including I, aVL and V2, ST changes in inferior leads and v6. Because of this, troponins were drawn and pending. Additionally, a lipid panel is pending given the pancreatitis.   At hand off, plan is obtaining troponin and lipid panel. If both are normal can be discharged home with PCP and cardiology referral.  0740: Lipid panel with normal  triglycerides at 102.  Additionally his troponin came back at 14.  This was a new draw and not added onto labs drawn earlier in the night.  Do not feel that he needs a delta troponin at this time as the first one is negative.  As stated above he will have follow-up PCP and cardiology.  He is being prescribed Percocet and Zofran by previous provider.  Given return precautions for any worsening symptoms.  The patient has been appropriately medically screened and/or stabilized in the ED. I have low suspicion for any other emergent medical condition which would require further screening, evaluation or treatment in the ED or require inpatient management. At time of discharge the patient is hemodynamically stable and in no acute distress. I have discussed work-up results and diagnosis with patient and answered all questions. Patient is agreeable with discharge plan. We discussed strict return precautions for returning to the emergency department and they verbalized understanding.             Cristopher Peru, PA-C 07/18/22 0745    Marily Memos, MD 07/18/22 2308

## 2022-07-18 NOTE — Progress Notes (Signed)
Transition of Care [facility]) - Emergency Department Mini Assessment   Patient Details  Name: Carlos Blankenship MRN: [id number] Date of Birth: 21-Mar-1975  Transition of Care [facility]) CM/SW Contact:    Carlos Cohn, RN Phone Number: 07/18/2022, 8:42 AM   Clinical Narrative: RNCM consulted regarding PCP establishment and insurance enrollment. Pt presented to [facility] ED today with abd pain LUQ radiating to left flank area . RNCM obtained appointment on (7/1), time (1:15) with [place] and placed on After Visit Summary paperwork.  No further case management needs communicated at this time. Carlos Suliman J. Lucretia Roers, RN, BSN, [place] [phone]    ED Mini Assessment: What brought you to the Emergency Department? : (P) abd pain LUQ radiating to left flank area  Barriers to Discharge: (P) ED PCP office hours     Means of departure: (P) Car  Interventions which prevented an admission or readmission: PCP Appointment Scheduled    Patient Contact and Communications        ,          Patient states their goals for this hospitalization and ongoing recovery are:: (P) Find out where this pain is coming from      Admission diagnosis:  abd pain There are no problems to display for this patient.  PCP:  Pcp, No Pharmacy:   [facility] [address], [place] - [address] [place] [address] Phone: [phone] Fax: [phone]  [id number] [place] - [address] [place] ([place]) [place] [address] Phone: [phone] Fax: [phone]

## 2022-07-18 NOTE — ED Provider Notes (Signed)
[facility]-EMERGENCY DEPT [facility] Emergency Department Provider Note MRN:  [id number]  Arrival date & time: 07/18/22     Chief Complaint   Abdominal Pain   History of Present Illness   Carlos Blankenship is a 47 y.o. year-old male presents to the ED with chief complaint of left sided abdominal pain that started about 8pm.  He describes it as sharp and stabbing.  He reports some dark urine, but no dysuria.  Denies fevers, chills, vomiting, or diarrhea.  He has had some nausea.  Last ETOH was on Saturday.  Denies chest pain, SOB, or diaphoresis.Marland Kitchen  History provided by patient.   Review of Systems  Pertinent positive and negative review of systems noted in HPI.    Physical Exam   Vitals:   07/18/22 0348 07/18/22 0505  BP:  (!) 186/111  Pulse:  67  Resp:  16  Temp:  97.8 F (36.6 C)  SpO2: 99% 95%    CONSTITUTIONAL:  well-appearing, NAD NEURO:  Alert and oriented x 3, CN 3-12 grossly intact EYES:  eyes equal and reactive ENT/NECK:  Supple, no stridor  CARDIO:  normal rate, regular rhythm, appears well-perfused  PULM:  No respiratory distress, CTAB GI/GU:  non-distended,  MSK/SPINE:  No gross deformities, no edema, moves all extremities  SKIN:  no rash, atraumatic   *Additional and/or pertinent findings included in MDM below  Diagnostic and Interventional Summary    EKG Interpretation  Date/Time:  Tuesday July 18 2022 04:55:48 EDT Ventricular Rate:  68 PR Interval:  142 QRS Duration: 107 QT Interval:  383 QTC Calculation: 408 R Axis:   -60 Text Interpretation: Sinus rhythm Left anterior fascicular block Repol abnrm suggests ischemia, inferior leads Minimal ST elevation, anterolateral leads ST elevation in multiple leads including I, aVL and V2 ST changes in inferior leads and v6 no comparison Confirmed by Marily Memos [phone]) on 07/18/2022 5:57:11 AM       Labs Reviewed  LIPASE, BLOOD - Abnormal; Notable for the following components:      Result Value    Lipase 60 (*)    All other components within normal limits  COMPREHENSIVE METABOLIC PANEL - Abnormal; Notable for the following components:   Potassium 3.4 (*)    Glucose, Bld 131 (*)    All other components within normal limits  CBC - Abnormal; Notable for the following components:   RBC 5.96 (*)    MCV 74.2 (*)    MCH 23.7 (*)    All other components within normal limits  URINALYSIS, ROUTINE W REFLEX MICROSCOPIC - Abnormal; Notable for the following components:   Hgb urine dipstick MODERATE (*)    Protein, ur 30 (*)    Bacteria, UA RARE (*)    All other components within normal limits  LIPID PANEL  TROPONIN I (HIGH SENSITIVITY)    CT Renal Stone Study  Final Result      Medications  amLODipine (NORVASC) tablet 10 mg (has no administration in time range)  hydrochlorothiazide (HYDRODIURIL) tablet 25 mg (has no administration in time range)  ketorolac (TORADOL) 30 MG/ML injection 30 mg (30 mg Intravenous Given 07/18/22 0502)  ondansetron (ZOFRAN) injection 4 mg (4 mg Intravenous Given 07/18/22 0502)  oxyCODONE-acetaminophen (PERCOCET/ROXICET) 5-325 MG per tablet 2 tablet (2 tablets Oral Given 07/18/22 0605)     Procedures  /  Critical Care Procedures  ED Course and Medical Decision Making  I have reviewed the triage vital signs, the nursing notes, and pertinent available records from the  EMR.  Social Determinants Affecting Complexity of Care: Patient has no clinically significant social determinants affecting this chief complaint..   ED Course:    Medical Decision Making Amount and/or Complexity of Data Reviewed Labs: ordered.    Details: Mildly elevated lipase at 60 No leukocytosis. UA shows hematuria Radiology: ordered and independent interpretation performed.    Details: No stone seen on CT  Risk Prescription drug management.     Consultants:    Treatment and Plan: Patient signed out to oncoming team, who will continue care.  Plan: Follow-up on trop.   If negative, DC with cardiology follow-up.  Return precautions for pancreatitis.  Patient seen by and discussed with attending physician, Dr. Clayborne Dana, who agrees with plan.  Final Clinical Impressions(s) / ED Diagnoses     ICD-10-CM   1. Acute pancreatitis, unspecified complication status, unspecified pancreatitis type  K85.90     2. Abnormal EKG  R94.31 Ambulatory referral to Cardiology      ED Discharge Orders          Ordered    Ambulatory referral to Cardiology       Comments: If you have not heard from the Cardiology office within the next 72 hours please call [phone].   07/18/22 0624    oxyCODONE-acetaminophen (PERCOCET) 5-325 MG tablet  Every 6 hours PRN        07/18/22 0625    ondansetron (ZOFRAN-ODT) 4 MG disintegrating tablet  Every 8 hours PRN        07/18/22 2956              Discharge Instructions Discussed with and Provided to Patient:   Discharge Instructions   None      Roxy Horseman, PA-C 07/18/22 0645    Mesner, Barbara Cower, MD 07/18/22 2307

## 2022-07-31 ENCOUNTER — Ambulatory Visit: Payer: Medicaid Other | Admitting: Student

## 2022-07-31 ENCOUNTER — Other Ambulatory Visit: Payer: Self-pay

## 2022-07-31 ENCOUNTER — Encounter: Payer: Self-pay | Admitting: Student

## 2022-07-31 VITALS — BP 183/103 | HR 74 | Temp 98.6°F | Ht 73.0 in | Wt 345.2 lb

## 2022-07-31 DIAGNOSIS — R311 Benign essential microscopic hematuria: Secondary | ICD-10-CM | POA: Diagnosis not present

## 2022-07-31 DIAGNOSIS — F17210 Nicotine dependence, cigarettes, uncomplicated: Secondary | ICD-10-CM

## 2022-07-31 DIAGNOSIS — I10 Essential (primary) hypertension: Secondary | ICD-10-CM | POA: Diagnosis present

## 2022-07-31 DIAGNOSIS — E785 Hyperlipidemia, unspecified: Secondary | ICD-10-CM

## 2022-07-31 DIAGNOSIS — Z131 Encounter for screening for diabetes mellitus: Secondary | ICD-10-CM

## 2022-07-31 MED ORDER — LOSARTAN POTASSIUM-HCTZ 100-25 MG PO TABS
1.0000 | ORAL_TABLET | Freq: Every day | ORAL | 3 refills | Status: DC
Start: 2022-07-31 — End: 2023-04-04

## 2022-07-31 MED ORDER — ATORVASTATIN CALCIUM 20 MG PO TABS
20.0000 mg | ORAL_TABLET | Freq: Every day | ORAL | 3 refills | Status: DC
Start: 2022-07-31 — End: 2023-08-27

## 2022-07-31 NOTE — Assessment & Plan Note (Signed)
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 ~15 pack years. Recommended cutting intake by half to start with help of patches and gum. At return assess for effectiveness, consider medication assisted cessation at that time.
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
## 2022-07-31 NOTE — Assessment & Plan Note (Addendum)
Stage II hypertension, 183/103 today. Currently asymptomatic. Has been treated in the past but is not currently taking any antihypertensives.  Driving a significant portion of this person's risk of ASCVD, along with continued smoking and high cholesterol. Start ARB/thiazide combo after BMP results return and return to clinic in 1 month to assess response to therapy and repeat BMP. - losartan/hydrochlorothiazide 100/25

## 2022-07-31 NOTE — Assessment & Plan Note (Signed)
Obesity. No personal or family history of diabetes. - A1c

## 2022-07-31 NOTE — Assessment & Plan Note (Signed)
LDL goal < 70 for this person with intermediate ASCVD risk. Start moderate intensity statin today. - Atorvastatin 20 mg

## 2022-07-31 NOTE — Assessment & Plan Note (Signed)
Microscopic hematuria during last ED visit. Going back through chart shows persistent hematuria since at least 2021. Looks as though this was attributed to hypertensive nephropathy in the past. UA notable for proteinuria as well. Creatinine stable. CT urogram during recent ED visit was negative for stones or structural renal disease. Anticipate repeat UA will show RBCs, this person should have cystoscopy as next step in workup. Differential also includes glomerular bleeding given concomitant proteinuria. I think this lends urgency to improved BP control to prevent kidney disease down the road. - Repeat UA

## 2022-07-31 NOTE — Patient Instructions (Signed)
This after visit summary is an important review of tests, referrals, and medication changes that were discussed during your visit. If you have questions or concerns, call [phone]. Outside of clinic business hours, call the main hospital at [phone] and ask the operator for the on-call internal medicine resident.   Ernesta Amble MD 07/31/2022, 2:33 PM

## 2022-08-01 ENCOUNTER — Other Ambulatory Visit: Payer: Self-pay | Admitting: Student

## 2022-08-01 DIAGNOSIS — R311 Benign essential microscopic hematuria: Secondary | ICD-10-CM

## 2022-08-01 LAB — URINALYSIS, ROUTINE W REFLEX MICROSCOPIC
Bilirubin, UA: NEGATIVE
Glucose, UA: NEGATIVE
Leukocytes,UA: NEGATIVE
Nitrite, UA: NEGATIVE
Specific Gravity, UA: 1.023 (ref 1.005–1.030)
Urobilinogen, Ur: 1 mg/dL (ref 0.2–1.0)
pH, UA: 7 (ref 5.0–7.5)

## 2022-08-01 LAB — HEMOGLOBIN A1C
Est. average glucose Bld gHb Est-mCnc: 123 mg/dL
Hgb A1c MFr Bld: 5.9 % — ABNORMAL HIGH (ref 4.8–5.6)

## 2022-08-01 LAB — BMP8+ANION GAP
Anion Gap: 14 mmol/L (ref 10.0–18.0)
BUN/Creatinine Ratio: 15 (ref 9–20)
BUN: 12 mg/dL (ref 6–24)
CO2: 24 mmol/L (ref 20–29)
Calcium: 9.4 mg/dL (ref 8.7–10.2)
Chloride: 104 mmol/L (ref 96–106)
Creatinine, Ser: 0.81 mg/dL (ref 0.76–1.27)
Glucose: 90 mg/dL (ref 70–99)
Potassium: 4.2 mmol/L (ref 3.5–5.2)
Sodium: 142 mmol/L (ref 134–144)
eGFR: 108 mL/min/{1.73_m2} (ref >59)

## 2022-08-01 LAB — MICROSCOPIC EXAMINATION
Bacteria, UA: NONE SEEN
Casts: NONE SEEN /lpf
Epithelial Cells (non renal): NONE SEEN /hpf (ref 0–10)
RBC, Urine: >30 /hpf — AB (ref 0–2)
WBC, UA: NONE SEEN /hpf (ref 0–5)

## 2022-08-01 NOTE — Addendum Note (Signed)
Addended by: Derrek Monaco on: 08/01/2022 09:53 AM   Modules accepted: Level of Service

## 2022-08-01 NOTE — Progress Notes (Signed)
Internal Medicine Clinic Attending  Case discussed with Dr. Benito Mccreedy  At the time of the visit.  We reviewed the resident's history and exam and pertinent patient test results.  I agree with the assessment, diagnosis, and plan of care documented in the resident's note.    U/A with persistent microscopic hematuria. I agree with Dr. Dirk Dress plan for Urology referral for cystoscopy

## 2022-08-04 ENCOUNTER — Telehealth: Payer: Self-pay

## 2022-08-04 DIAGNOSIS — I10 Essential (primary) hypertension: Secondary | ICD-10-CM

## 2022-08-04 NOTE — Telephone Encounter (Signed)
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 Made 2 recommendations: 1) get blood pressure cuff for home monitoring, ensure cuff size is appropriate as he has large arms. 2) come to clinic at 8:45 a.m. on 08/07/22 for nurse only visit-BP check.

## 2022-08-04 NOTE — Telephone Encounter (Signed)
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 Please call pt back.

## 2022-08-04 NOTE — Telephone Encounter (Signed)
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  Stated that he feels his medication is not working.  Explained to patient that he has only been on med since 07/31/2022.  Patient is requesting a call from Dr. Benito Mccreedy to discuss getting a letter for DOT .

## 2022-08-07 ENCOUNTER — Ambulatory Visit: Payer: Medicaid Other

## 2022-08-22 ENCOUNTER — Encounter: Payer: Medicaid Other | Admitting: Student

## 2022-08-28 ENCOUNTER — Telehealth: Payer: Self-pay

## 2022-08-28 NOTE — Telephone Encounter (Signed)
Patient called he stated his bp meds is not working he stated the diastolic number ranges between 77-80, he can not get the systolic number any lower than 168, patient stated the systolic number ranges between 168-179. Please return patients call.

## 2022-08-28 NOTE — Telephone Encounter (Signed)
Call from patient requesting a letter stating that he has high blood pressure and is on treatment for.  Patient upset and said that his calls have not been returned.  Patient was asked why he did not keep his appointment om 08/22/2022.  Patient stated that he as unaware that he had an appointment and no one called to remind him.  Patient is requesting a letter and a change in his blood pressure medication.  Stated that he has talked to people who stated that at [facility] he can go in and have his blood pressure checked daily until it goes down.  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

## 2022-08-29 ENCOUNTER — Encounter: Payer: Self-pay | Admitting: Internal Medicine

## 2022-08-29 ENCOUNTER — Ambulatory Visit: Payer: Medicaid Other | Admitting: Internal Medicine

## 2022-08-29 ENCOUNTER — Other Ambulatory Visit: Payer: Self-pay

## 2022-08-29 ENCOUNTER — Other Ambulatory Visit: Payer: Self-pay | Admitting: Internal Medicine

## 2022-08-29 VITALS — BP 148/95 | HR 77 | Temp 98.6°F | Resp 28 | Ht 73.0 in | Wt 340.7 lb

## 2022-08-29 DIAGNOSIS — I10 Essential (primary) hypertension: Secondary | ICD-10-CM

## 2022-08-29 MED ORDER — AMLODIPINE BESYLATE 5 MG PO TABS
5.0000 mg | ORAL_TABLET | Freq: Every day | ORAL | 11 refills | Status: DC
Start: 2022-08-29 — End: 2023-04-04

## 2022-08-29 NOTE — Progress Notes (Signed)
Acute Office Visit  Subjective:     Patient ID: Carlos Blankenship, male    DOB: [birth year], 47 y.o.   MRN: [id number]  Chief Complaint  Patient presents with   Hypertension    Carlos Blankenship was seen in clinic today for a BP RN visit and was subsequently scheduled for a visit to discuss hypertension and work-related concerns. Please see assessment/plan in problem-based charting for further details of today's visit.       Objective:    BP (!) 148/95 (BP Location: Right Arm, Patient Position: Sitting, Cuff Size: Large)   Pulse 77   Temp 98.6 F (37 C) (Oral)   Resp (!) 28   Ht 6\' 1"  (1.854 m)   Wt (!) 340 lb 11.2 oz (154.5 kg)   SpO2 100% Comment: room air  BMI 44.95 kg/m  BP Readings from Last 3 Encounters:  08/29/22 (!) 148/95  07/31/22 (!) 183/103  07/18/22 (!) 194/116   Wt Readings from Last 3 Encounters:  08/29/22 (!) 340 lb 11.2 oz (154.5 kg)  07/31/22 (!) 345 lb 3.2 oz (156.6 kg)  07/18/22 (!) 308 lb 10.3 oz (140 kg)     Physical Exam Constitutional:      Appearance: Normal appearance. He is obese.  Pulmonary:     Effort: Pulmonary effort is normal.  Neurological:     General: No focal deficit present.     Mental Status: He is alert.  Psychiatric:        Mood and Affect: Mood normal.        Behavior: Behavior normal.       Assessment & Plan:   Problem List Items Addressed This Visit       Cardiovascular and Mediastinum   Primary hypertension - Primary    Carlos Blankenship returned to clinic today for RN BP visit. He was last seen in clinic by Dr. Benito Mccreedy to establish care on 07/31/22. During this visit, he was initiated on losartan-hydrochlorothiazide 100/25 mg daily for hypertension. Since this visit, Carlos Blankenship has been watching his salt intake, going to the gym, and cutting back on cigarette use. He has measured his BP at home, this morning was 160/101. Blood pressures noted in clinic today are improved from prior, but remain elevated at 160/96 > 148/95.  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 We discussed improvement with current medications, however he will need additional medication for improvement. We discussed the addition of amlodipine 5 mg, as well as reviewed adverse effects and reasons to call the clinic. He will return to clinic on 8/2 for BP RN visit and labs. I have discussed with Dr. Criselda Peaches who will be in clinic on Friday morning in case he needs to be seen for further titration. 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 He would also like to be seen for further discussion of weight management, I will send a message to the front desk for scheduling.  Plan -Continue losartan-hydrochlorothiazide 100-25 mg -Start amlodipine 5 mg daily -BMP and RN BP visit on Friday, August 2 -Return to clinic in one month for further discussion of weight management        Relevant Medications   amLODipine (NORVASC) 5  MG tablet   Other Relevant Orders   BMP8+Anion Gap    Meds ordered this encounter  Medications   amLODipine (NORVASC) 5 MG tablet    Sig: Take 1 tablet (5 mg total) by mouth daily.    Dispense:  30 tablet    Refill:  11    Return for BP RN visit on Friday, 8/2.  Dickie La, MD

## 2022-08-29 NOTE — Assessment & Plan Note (Signed)
Carlos Blankenship returned to clinic today for RN BP visit. He was last seen in clinic by Carlos Blankenship to establish care on 07/31/22. During this visit, he was initiated on losartan-hydrochlorothiazide 100/25 mg daily for hypertension. Since this visit, Carlos Blankenship has been watching his salt intake, going to the gym, and cutting back on cigarette use. He has measured his BP at home, this morning was 160/101. Blood pressures noted in clinic today are improved from prior, but remain elevated at 160/96 > 148/95. 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 We discussed improvement with current medications, however he will need additional medication for improvement. We discussed the addition of amlodipine 5 mg, as well as reviewed adverse effects and reasons to call the clinic. He will return to clinic on 8/2 for BP RN visit and labs. I have discussed with Carlos Blankenship who will be in clinic on Friday morning in case he needs to be seen for further titration. 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 He would also like to be seen for further discussion of weight management, I will send a message to the front desk for scheduling.  Plan -Continue losartan-hydrochlorothiazide 100-25 mg -Start amlodipine 5 mg daily -BMP and RN BP visit on Friday, August 2 -Return to clinic in one month for further discussion of weight management

## 2022-08-29 NOTE — Progress Notes (Signed)
    Carlos Blankenship presented today for blood pressure check. Patient is prescribed blood pressure medications and I confirmed that patient did take their blood pressure medication prior to today's appointment. Blood pressure was taken in the usual and appropriate manner using an automated BP cuff.    160/96 81  There were no vitals filed for this visit.    Results of today's visit will be routed to Dr. Benito Mccreedy for review and further management.

## 2022-08-30 ENCOUNTER — Encounter: Payer: Medicaid Other | Admitting: Student

## 2022-09-01 ENCOUNTER — Ambulatory Visit: Payer: Medicaid Other

## 2022-09-04 NOTE — Progress Notes (Deleted)
Cardiology Office Note:   Date:  09/05/2022  NAME:  Carlos Blankenship    MRN: [id number] DOB:  1976/01/04   PCP:  Marrianne Mood, MD  Cardiologist:  None  Electrophysiologist:  None   Referring MD: Roxy Horseman, PA-C   No chief complaint on file.   History of Present Illness:   Carlos Blankenship is a 47 y.o. male with a hx of obesity, HTN, HLD who is being seen today for the evaluation of abnormal EKG at the request of Marrianne Mood, MD. Seen in ER for abdominal pain and referred to cardiology for abnormal EKG.   Problem List HTN Obesity -BMI 44 3. Tobacco abuse  4. HLD -T chol 177, HDL 59, LDL 98, TG 102  Past Medical History: Past Medical History:  Diagnosis Date   Hypertension     Past Surgical History: No past surgical history on file.  Current Medications: No outpatient medications have been marked as taking for the 09/05/22 encounter (Appointment) with O'Neal, Ronnald Ramp, MD.     Allergies:    Patient has no known allergies.   Social History: Social History   Socioeconomic History   Marital status: Single    Spouse name: Not on file   Number of children: Not on file   Years of education: Not on file   Highest education level: Not on file  Occupational History   Not on file  Tobacco Use   Smoking status: Every Day    Current packs/day: 0.30    Types: Cigarettes   Smokeless tobacco: Never   Tobacco comments:    1/3/pack per day  Substance and Sexual Activity   Alcohol use: Yes    Comment: occasional   Drug use: No    Types: Marijuana   Sexual activity: Not on file  Other Topics Concern   Not on file  Social History Narrative   Not on file   Social Determinants of Health   Financial Resource Strain: Not on file  Food Insecurity: Not on file  Transportation Needs: Not on file  Physical Activity: Not on file  Stress: Not on file  Social Connections: Unknown (06/13/2021)   Received from [facility], [facility]   Social  Network    Social Network: Not on file     Family History: The patient's family history is not on file.  ROS:   All other ROS reviewed and negative. Pertinent positives noted in the HPI.     EKGs/Labs/Other Studies Reviewed:   The following studies were personally reviewed by me today:  EKG:  EKG is *** ordered today.        Recent Labs: 07/18/2022: ALT 23; Hemoglobin 14.1; Platelets 236 07/31/2022: BUN 12; Creatinine, Ser 0.81; Potassium 4.2; Sodium 142   Recent Lipid Panel    Component Value Date/Time   CHOL 177 07/18/2022 0354   TRIG 102 07/18/2022 0354   HDL 59 07/18/2022 0354   CHOLHDL 3.0 07/18/2022 0354   VLDL 20 07/18/2022 0354   LDLCALC 98 07/18/2022 0354    Physical Exam:   VS:  There were no vitals taken for this visit.   Wt Readings from Last 3 Encounters:  08/29/22 (!) 340 lb 11.2 oz (154.5 kg)  07/31/22 (!) 345 lb 3.2 oz (156.6 kg)  07/18/22 (!) 308 lb 10.3 oz (140 kg)    General: Well nourished, well developed, in no acute distress Head: Atraumatic, normal size  Eyes: PEERLA, EOMI  Neck: Supple, no JVD Endocrine: No thryomegaly  Cardiac: Normal S1, S2; RRR; no murmurs, rubs, or gallops Lungs: Clear to auscultation bilaterally, no wheezing, rhonchi or rales  Abd: Soft, nontender, no hepatomegaly  Ext: No edema, pulses 2+ Musculoskeletal: No deformities, BUE and BLE strength normal and equal Skin: Warm and dry, no rashes   Neuro: Alert and oriented to person, place, time, and situation, CNII-XII grossly intact, no focal deficits  Psych: Normal mood and affect   ASSESSMENT:   Carlos Blankenship is a 47 y.o. male who presents for the following: 1. Nonspecific abnormal electrocardiogram (ECG) (EKG)   2. Renovascular hypertension     PLAN:   There are no diagnoses linked to this encounter.  {Are you ordering a CV Procedure (e.g. stress test, cath, DCCV, TEE, etc)?   Press F2        :[id number]}  Disposition: No follow-ups on file.  Medication  Adjustments/Labs and Tests Ordered: Current medicines are reviewed at length with the patient today.  Concerns regarding medicines are outlined above.  No orders of the defined types were placed in this encounter.  No orders of the defined types were placed in this encounter.  There are no Patient Instructions on file for this visit.   Time Spent with Patient: I have spent a total of *** minutes with patient reviewing hospital notes, telemetry, EKGs, labs and examining the patient as well as establishing an assessment and plan that was discussed with the patient.  > 50% of time was spent in direct patient care.  Signed, Lenna Gilford. Flora Lipps, MD, [facility]  [facility]  [address], [address] [place], [place] [address] [phone]  09/05/2022 9:23 AM

## 2022-09-05 ENCOUNTER — Ambulatory Visit: Payer: Medicaid Other | Attending: Cardiovascular Disease | Admitting: Cardiovascular Disease

## 2022-09-05 DIAGNOSIS — I150 Renovascular hypertension: Secondary | ICD-10-CM

## 2022-09-05 DIAGNOSIS — R9431 Abnormal electrocardiogram [ECG] [EKG]: Secondary | ICD-10-CM

## 2022-09-13 NOTE — Progress Notes (Deleted)
Cardiology Office Note:   Date:  09/13/2022  NAME:  Carlos Blankenship    MRN: [id number] DOB:  03/03/1975   PCP:  Marrianne Mood, MD  Cardiologist:  None  Electrophysiologist:  None   Referring MD: Roxy Horseman, PA-C   No chief complaint on file.   History of Present Illness:   Carlos Blankenship is a 47 y.o. male with a hx of HTN who is being seen today for the evaluation of abnormal EKG at the request of Marrianne Mood, MD. Seen in ER 6/18 for abdominal pain. EKG with early repol and TWI inferior leads.   ***  Past Medical History: Past Medical History:  Diagnosis Date   Hypertension     Past Surgical History: No past surgical history on file.  Current Medications: No outpatient medications have been marked as taking for the 09/14/22 encounter (Appointment) with O'Neal, Ronnald Ramp, MD.     Allergies:    Patient has no known allergies.   Social History: Social History   Socioeconomic History   Marital status: Single    Spouse name: Not on file   Number of children: Not on file   Years of education: Not on file   Highest education level: Not on file  Occupational History   Not on file  Tobacco Use   Smoking status: Every Day    Current packs/day: 0.30    Types: Cigarettes   Smokeless tobacco: Never   Tobacco comments:    1/3/pack per day  Substance and Sexual Activity   Alcohol use: Yes    Comment: occasional   Drug use: No    Types: Marijuana   Sexual activity: Not on file  Other Topics Concern   Not on file  Social History Narrative   Not on file   Social Determinants of Health   Financial Resource Strain: Not on file  Food Insecurity: Not on file  Transportation Needs: Not on file  Physical Activity: Not on file  Stress: Not on file  Social Connections: Unknown (06/13/2021)   Received from [facility]   Social Network    Social Network: Not on file     Family History: The patient's family history is not on  file.  ROS:   All other ROS reviewed and negative. Pertinent positives noted in the HPI.     EKGs/Labs/Other Studies Reviewed:   The following studies were personally reviewed by me today:  EKG:  EKG is *** ordered today.        Recent Labs: 07/18/2022: ALT 23; Hemoglobin 14.1; Platelets 236 07/31/2022: BUN 12; Creatinine, Ser 0.81; Potassium 4.2; Sodium 142   Recent Lipid Panel    Component Value Date/Time   CHOL 177 07/18/2022 0354   TRIG 102 07/18/2022 0354   HDL 59 07/18/2022 0354   CHOLHDL 3.0 07/18/2022 0354   VLDL 20 07/18/2022 0354   LDLCALC 98 07/18/2022 0354    Physical Exam:   VS:  There were no vitals taken for this visit.   Wt Readings from Last 3 Encounters:  08/29/22 (!) 340 lb 11.2 oz (154.5 kg)  07/31/22 (!) 345 lb 3.2 oz (156.6 kg)  07/18/22 (!) 308 lb 10.3 oz (140 kg)    General: Well nourished, well developed, in no acute distress Head: Atraumatic, normal size  Eyes: PEERLA, EOMI  Neck: Supple, no JVD Endocrine: No thryomegaly Cardiac: Normal S1, S2; RRR; no murmurs, rubs, or gallops Lungs: Clear to auscultation bilaterally, no wheezing, rhonchi or rales  Abd: Soft, nontender, no hepatomegaly  Ext: No edema, pulses 2+ Musculoskeletal: No deformities, BUE and BLE strength normal and equal Skin: Warm and dry, no rashes   Neuro: Alert and oriented to person, place, time, and situation, CNII-XII grossly intact, no focal deficits  Psych: Normal mood and affect   ASSESSMENT:   Carlos Blankenship is a 47 y.o. male who presents for the following: No diagnosis found.  PLAN:   There are no diagnoses linked to this encounter.  {Are you ordering a CV Procedure (e.g. stress test, cath, DCCV, TEE, etc)?   Press F2        :[id number]}  Disposition: No follow-ups on file.  Medication Adjustments/Labs and Tests Ordered: Current medicines are reviewed at length with the patient today.  Concerns regarding medicines are outlined above.  No orders of the defined  types were placed in this encounter.  No orders of the defined types were placed in this encounter.  There are no Patient Instructions on file for this visit.   Time Spent with Patient: I have spent a total of *** minutes with patient reviewing hospital notes, telemetry, EKGs, labs and examining the patient as well as establishing an assessment and plan that was discussed with the patient.  > 50% of time was spent in direct patient care.  Signed, Lenna Gilford. Flora Lipps, MD, [facility]  [facility]  [address], [address] [place], [place] [address] [phone]  09/13/2022 10:16 PM

## 2022-09-14 ENCOUNTER — Ambulatory Visit: Payer: Medicaid Other | Attending: Cardiovascular Disease | Admitting: Cardiovascular Disease

## 2022-09-14 DIAGNOSIS — I150 Renovascular hypertension: Secondary | ICD-10-CM

## 2022-09-14 DIAGNOSIS — R9431 Abnormal electrocardiogram [ECG] [EKG]: Secondary | ICD-10-CM

## 2023-04-04 ENCOUNTER — Ambulatory Visit: Payer: Self-pay | Admitting: Student

## 2023-04-04 ENCOUNTER — Other Ambulatory Visit: Payer: Self-pay | Admitting: Internal Medicine

## 2023-04-04 ENCOUNTER — Ambulatory Visit: Admitting: Nurse Practitioner

## 2023-04-04 ENCOUNTER — Ambulatory Visit: Admitting: Internal Medicine

## 2023-04-04 ENCOUNTER — Other Ambulatory Visit: Payer: Self-pay

## 2023-04-04 VITALS — BP 178/90 | HR 76 | Temp 98.3°F | Resp 24 | Ht 73.0 in | Wt 362.8 lb

## 2023-04-04 DIAGNOSIS — K219 Gastro-esophageal reflux disease without esophagitis: Secondary | ICD-10-CM

## 2023-04-04 DIAGNOSIS — Z6841 Body Mass Index (BMI) 40.0 and over, adult: Secondary | ICD-10-CM | POA: Diagnosis not present

## 2023-04-04 DIAGNOSIS — I10 Essential (primary) hypertension: Secondary | ICD-10-CM | POA: Diagnosis not present

## 2023-04-04 MED ORDER — SEMAGLUTIDE-WEIGHT MANAGEMENT 1 MG/0.5ML ~~LOC~~ SOAJ: 1.0000 mg | SUBCUTANEOUS | 0 refills | Status: DC

## 2023-04-04 MED ORDER — LOSARTAN POTASSIUM-HCTZ 100-25 MG PO TABS
1.0000 | ORAL_TABLET | Freq: Every day | ORAL | 3 refills | Status: AC
Start: 2023-04-04 — End: ?

## 2023-04-04 MED ORDER — SEMAGLUTIDE-WEIGHT MANAGEMENT 0.25 MG/0.5ML ~~LOC~~ SOAJ: 0.2500 mg | SUBCUTANEOUS | 0 refills | Status: AC

## 2023-04-04 MED ORDER — SEMAGLUTIDE-WEIGHT MANAGEMENT 2.4 MG/0.75ML ~~LOC~~ SOAJ: 2.4000 mg | SUBCUTANEOUS | 0 refills | Status: AC

## 2023-04-04 MED ORDER — SEMAGLUTIDE-WEIGHT MANAGEMENT 0.5 MG/0.5ML ~~LOC~~ SOAJ: 0.5000 mg | SUBCUTANEOUS | 0 refills | Status: AC

## 2023-04-04 MED ORDER — AMLODIPINE BESYLATE 5 MG PO TABS
5.0000 mg | ORAL_TABLET | Freq: Every day | ORAL | 3 refills | Status: AC
Start: 2023-04-04 — End: 2024-04-03

## 2023-04-04 MED ORDER — SEMAGLUTIDE-WEIGHT MANAGEMENT 1.7 MG/0.75ML ~~LOC~~ SOAJ: 1.7000 mg | SUBCUTANEOUS | 0 refills | Status: AC

## 2023-04-04 MED ORDER — PANTOPRAZOLE SODIUM 40 MG PO TBEC: 40.0000 mg | DELAYED_RELEASE_TABLET | Freq: Every day | ORAL | 1 refills | Status: DC

## 2023-04-04 NOTE — Telephone Encounter (Signed)
 Chief Complaint: Abd Pain Symptoms: bloating, reflux Frequency: 6 weeks Pertinent Negatives: Patient denies fever, N/V/D, CP, SOB Disposition: [] ED /[] Urgent Care (no appt availability in office) / [x] Appointment(In office/virtual)/ []  [facility]/ [] Home Care/ [] Refused Recommended Disposition /[] [facility]/ []  Follow-up with PCP Additional Notes: Pt reports he has been experiencing upper abd pain, reflux and bloating for the past 6 weeks. Pt notes he has been taking 3 Tums to treat symptoms with little to no relief. OV scheduled today. This RN educated pt on home care, new-worsening symptoms, when to call back/seek emergent care. Pt verbalized understanding and agrees to plan.    Copied from CRM [id number]. Topic: Clinical - Red Word Triage >> Apr 04, 2023  9:57 AM Tiffany H wrote: Patient called to schedule an appointment with Dr. Benito Mccreedy. Patient advised that he's been having progressively worse stomach pains for the past 2 months. Patient advised that he has acid reflux that wakes up him at night. When awake, pain idles around a level 3. When he goes to sleep, he'll wake up with pain level at a 7. Patient advised that the top of his stomach swells and hardens. Reason for Disposition  [1] MILD-MODERATE pain AND [2] constant AND [3] present > 2 hours  Answer Assessment - Initial Assessment Questions 1. LOCATION: "Where does it hurt?"      Upper Abd 2. RADIATION: "Does the pain shoot anywhere else?" (e.g., chest, back)     No 3. ONSET: "When did the pain begin?" (Minutes, hours or days ago)      About 6 weeks 4. SUDDEN: "Gradual or sudden onset?"     Pt not sure 5. PATTERN "Does the pain come and go, or is it constant?"    - If it comes and goes: "How long does it last?" "Do you have pain now?"     (Note: Comes and goes means the pain is intermittent. It goes away completely between bouts.)    - If constant: "Is it getting better, staying the same, or getting  worse?"      (Note: Constant means the pain never goes away completely; most serious pain is constant and gets worse.)      Intermittent 6. SEVERITY: "How bad is the pain?"  (e.g., Scale 1-10; mild, moderate, or severe)    - MILD (1-3): Doesn't interfere with normal activities, abdomen soft and not tender to touch.     - MODERATE (4-7): Interferes with normal activities or awakens from sleep, abdomen tender to touch.     - SEVERE (8-10): Excruciating pain, doubled over, unable to do any normal activities.       5/10 7. RECURRENT SYMPTOM: "Have you ever had this type of stomach pain before?" If Yes, ask: "When was the last time?" and "What happened that time?"      Yes, pt states there was no clear diagnosis 8. CAUSE: "What do you think is causing the stomach pain?"     Unknown 9. RELIEVING/AGGRAVATING FACTORS: "What makes it better or worse?" (e.g., antacids, bending or twisting motion, bowel movement)     Pt reports if he eats anything he didn't prepare the food, the symptoms are present 10. OTHER SYMPTOMS: "Do you have any other symptoms?" (e.g., back pain, diarrhea, fever, urination pain, vomiting)       Bloating, reflux  Answer Assessment - Initial Assessment Questions 1. LOCATION: "Where does it hurt?"      Upper abd 2. RADIATION: "Does the pain shoot anywhere  else?" (e.g., chest, back)     No 3. ONSET: "When did the pain begin?" (e.g., minutes, hours or days ago)      6 weeks ago 4. SUDDEN: "Gradual or sudden onset?"     Pt not sure 5. PATTERN "Does the pain come and go, or is it constant?"    - If it comes and goes: "How long does it last?" "Do you have pain now?"     (Note: Comes and goes means the pain is intermittent. It goes away completely between bouts.)    - If constant: "Is it getting better, staying the same, or getting worse?"      (Note: Constant means the pain never goes away completely; most serious pain is constant and gets worse.)      Intermittent 6. SEVERITY:  "How bad is the pain?"  (e.g., Scale 1-10; mild, moderate, or severe)    - MILD (1-3): Doesn't interfere with normal activities, abdomen soft and not tender to touch..     - MODERATE (4-7): Interferes with normal activities or awakens from sleep, abdomen tender to touch.     - SEVERE (8-10): Excruciating pain, doubled over, unable to do any normal activities.       5/10 7. RECURRENT SYMPTOM: "Have you ever had this type of stomach pain before?" If Yes, ask: "When was the last time?" and "What happened that time?"      Yes, no clear diagnosis 8. AGGRAVATING FACTORS: "Does anything seem to cause this pain?" (e.g., foods, stress, alcohol)     Pt reports worse when he eats food he didn't prepare 9. CARDIAC SYMPTOMS: "Do you have any of the following symptoms: chest pain, difficulty breathing, sweating, nausea?"     None 10. OTHER SYMPTOMS: "Do you have any other symptoms?" (e.g., back pain, diarrhea, fever, urination pain, vomiting)       Bloating, reflux  Protocols used: Abdominal Pain - Male-A-AH, Abdominal Pain - Upper-A-AH

## 2023-04-04 NOTE — Patient Instructions (Signed)
 Thank you, Carlos Blankenship for allowing [place] to provide your care today. Today we discussed:  Acid reflux Start protonix 40 mg once a day x 30 days to see if this helps your symptoms High blood pressure Restart losartan-hydrochlorothiazide 100-25 mg daily  Keep taking amlodipine 5 mg daily Come back in 4 weeks Weight loss Start wegovy 0.25 mg/week x 4 weeks, then you will increase to 0.5 mg/week x 4 weeks Keep up with exercising and watching your diet  I have ordered the following labs for you:  Lab Orders  No laboratory test(s) ordered today      Referrals ordered today:   Referral Orders  No referral(s) requested today     I have ordered the following medication/changed the following medications:   Stop the following medications: Medications Discontinued During This Encounter  Medication Reason   losartan-hydrochlorothiazide (HYZAAR) 100-25 MG tablet Reorder   amLODipine (NORVASC) 5 MG tablet Reorder     Start the following medications: Meds ordered this encounter  Medications   Semaglutide-Weight Management 0.25 MG/0.5ML SOAJ    Sig: Inject 0.25 mg into the skin once a week for 28 days.    Dispense:  2 mL    Refill:  0   Semaglutide-Weight Management 0.5 MG/0.5ML SOAJ    Sig: Inject 0.5 mg into the skin once a week for 28 days.    Dispense:  2 mL    Refill:  0   Semaglutide-Weight Management 1 MG/0.5ML SOAJ    Sig: Inject 1 mg into the skin once a week for 28 days.    Dispense:  2 mL    Refill:  0   Semaglutide-Weight Management 1.7 MG/0.75ML SOAJ    Sig: Inject 1.7 mg into the skin once a week for 28 days.    Dispense:  3 mL    Refill:  0   Semaglutide-Weight Management 2.4 MG/0.75ML SOAJ    Sig: Inject 2.4 mg into the skin once a week for 28 days.    Dispense:  3 mL    Refill:  0   losartan-hydrochlorothiazide (HYZAAR) 100-25 MG tablet    Sig: Take 1 tablet by mouth daily.    Dispense:  90 tablet    Refill:  3   amLODipine (NORVASC) 5 MG tablet     Sig: Take 1 tablet (5 mg total) by mouth daily.    Dispense:  90 tablet    Refill:  3   pantoprazole (PROTONIX) 40 MG tablet    Sig: Take 1 tablet (40 mg total) by mouth daily.    Dispense:  30 tablet    Refill:  1     Follow up:  1 month     Should you have any questions or concerns please call the internal medicine clinic at [phone].     Elza Rafter, D.O. [facility]

## 2023-04-04 NOTE — Progress Notes (Unsigned)
 CC: reflux  HPI:  Carlos Blankenship is a 48 y.o. male living with a history stated below and presents today for acid reflux. Please see problem based assessment and plan for additional details.  Past Medical History:  Diagnosis Date   Hypertension     Current Outpatient Medications on File Prior to Visit  Medication Sig Dispense Refill   atorvastatin (LIPITOR) 20 MG tablet Take 1 tablet (20 mg total) by mouth daily. 90 tablet 3   No current facility-administered medications on file prior to visit.    No family history on file.  Social History   Socioeconomic History   Marital status: Single    Spouse name: Not on file   Number of children: Not on file   Years of education: Not on file   Highest education level: Not on file  Occupational History   Not on file  Tobacco Use   Smoking status: Every Day    Current packs/day: 0.30    Types: Cigarettes   Smokeless tobacco: Never   Tobacco comments:    1/3/pack per day  Substance and Sexual Activity   Alcohol use: Yes    Comment: occasional   Drug use: No    Types: Marijuana   Sexual activity: Not on file  Other Topics Concern   Not on file  Social History Narrative   Not on file   Social Drivers of Health   Financial Resource Strain: Not on file  Food Insecurity: Not on file  Transportation Needs: Not on file  Physical Activity: Not on file  Stress: Not on file  Social Connections: Unknown (06/13/2021)   Received from [facility]   Social Network    Social Network: Not on file  Intimate Partner Violence: Unknown (05/05/2021)   Received from [facility], [facility]   HITS    Physically Hurt: Not on file    Insult or Talk Down To: Not on file    Threaten Physical Harm: Not on file    Scream or Curse: Not on file    Review of Systems: ROS negative except for what is noted on the assessment and plan.  Vitals:   04/04/23 1448 04/04/23 1449 04/04/23 1520  BP: (!) 158/101 (!) 167/85  (!) 178/90  Pulse: 84 85 76  Resp: (!) 24    Temp: 98.3 F (36.8 C)    TempSrc: Oral    SpO2: 98%    Weight: (!) 362 lb 12.8 oz (164.6 kg)    Height: 6\' 1"  (1.854 m)      Physical Exam: Constitutional: appears well, NAD Cardiovascular: regular rate and rhythm Pulmonary/Chest: normal work of breathing on room air Abdominal: soft, non-tender, non-distended Skin: warm and dry Psych: normal mood and behavior  Assessment & Plan:     Patient discussed with Dr. Cleda Daub  GERD (gastroesophageal reflux disease) The patient presents to the [facility] for an acute visit to discuss acid reflux.  He states that he has had acid reflux, bloating, and mild abdominal pain for about 6 weeks.  He has been taking 3 Tums per day, which relieved his symptoms.  He notices that his symptoms are worse when he eats out, and he does not often have his symptoms when he cooks his own food.  Even when he eats healthy foods at restaurants, such as salmon and rice or folate, he gets the acid reflux/burning symptoms.  He denies any dysphagia, odynophagia, vomiting, or any other red flags.  Plan: -  Protonix 40 mg daily  Morbid obesity with body mass index (BMI) of 45.0 to 49.9 in adult [facility]) The patient is morbidly obese with a BMI of 47.8 and a weight of 362 today.  The patient has been very active and has made many lifestyle modifications, but has not been able to lose any weight.  In fact, he has gained 20 pounds since July.  He states that over the last 4+ months he has exercised regularly at least 3 times per week with a trainer.  He is doing exercises that include the stationary bike, circuit training, and weightlifting.  In addition to his intense exercise regimen, his trainer is helping him to watch his diet.  He has been very strict with what foods he eats and has been cooking more meals at home (is not eating greasy/fatty/sugary foods).  Plan: - Wegovy 0.25 mg/week x 4 weeks (and then up titrate) - The patient  is going to continue with his intense exercise regimen as noted above and his dietary modifications  Primary hypertension The patient's blood pressure is elevated to 158/101 and 167/85 upon recheck.  He denies any headaches, vision changes, dizziness, or chest pain.  He has been out of his antihypertensives for about 1 month.  Plan: - Restart losartan-HCTZ 100-25 mg daily - Continue amlodipine 5 mg daily - Follow-up in 4 weeks to recheck blood pressure   Carlos Blankenship, D.O. [facility], PGY-3 Phone: [phone] Date 04/05/2023 Time 2:05 PM

## 2023-04-04 NOTE — Telephone Encounter (Signed)
 Pt stated he was informed no available appts with his provider;  given an appt elsewhere. Pt has been scheduled today with Dr Ned Card @ 1445 PM for c/o abd bloating and acid reflux.

## 2023-04-05 ENCOUNTER — Telehealth: Payer: Self-pay

## 2023-04-05 DIAGNOSIS — K219 Gastro-esophageal reflux disease without esophagitis: Secondary | ICD-10-CM | POA: Insufficient documentation

## 2023-04-05 NOTE — Assessment & Plan Note (Signed)
 The patient is morbidly obese with a BMI of 47.8 and a weight of 362 today.  The patient has been very active and has made many lifestyle modifications, but has not been able to lose any weight.  In fact, he has gained 20 pounds since July.  He states that over the last 4+ months he has exercised regularly at least 3 times per week with a trainer.  He is doing exercises that include the stationary bike, circuit training, and weightlifting.  In addition to his intense exercise regimen, his trainer is helping him to watch his diet.  He has been very strict with what foods he eats and has been cooking more meals at home (is not eating greasy/fatty/sugary foods).  Plan: - Wegovy 0.25 mg/week x 4 weeks (and then up titrate) - The patient is going to continue with his intense exercise regimen as noted above and his dietary modifications

## 2023-04-05 NOTE — Assessment & Plan Note (Addendum)
 The patient's blood pressure is elevated to 158/101 and 167/85 upon recheck.  He denies any headaches, vision changes, dizziness, or chest pain.  He has been out of his antihypertensives for about 1 month.  Plan: - Restart losartan-HCTZ 100-25 mg daily - Continue amlodipine 5 mg daily - Follow-up in 4 weeks to recheck blood pressure

## 2023-04-05 NOTE — Telephone Encounter (Signed)
 Celedonio Miyamoto (Key: [id number]) Rx #: [id number] OVFIEP 0.25MG /0.5ML auto-injectors Form [employer] Electronic Prior Authorization Form (2017 NCPDP) Created Sent to Plan Your prior authorization for Reginal Lutes has been approved! More Info Personalized support and financial assistance may be available through the [employer] program. For more information, and to see program requirements, click on the More Info button to the right.  Message from plan: Request Reference Number: [id number]. WEGOVY INJ 0.25MG  is approved through 10/06/2023. For further questions, call [employer] at [phone].Marland Kitchen Authorization Expiration Date: October 06, 2023.

## 2023-04-05 NOTE — Telephone Encounter (Signed)
 Prior Authorization for patient Carlos Blankenship 0.25MG /0.5ML auto-injectors) came through on cover my meds was submitted with last office notes awaiting approval or denial.  ZOX:[id number]

## 2023-04-05 NOTE — Assessment & Plan Note (Signed)
 The patient presents to the [facility] for an acute visit to discuss acid reflux.  He states that he has had acid reflux, bloating, and mild abdominal pain for about 6 weeks.  He has been taking 3 Tums per day, which relieved his symptoms.  He notices that his symptoms are worse when he eats out, and he does not often have his symptoms when he cooks his own food.  Even when he eats healthy foods at restaurants, such as salmon and rice or folate, he gets the acid reflux/burning symptoms.  He denies any dysphagia, odynophagia, vomiting, or any other red flags.  Plan: - Protonix 40 mg daily

## 2023-04-10 NOTE — Progress Notes (Signed)
 Internal Medicine Clinic Attending  Case discussed with the resident at the time of the visit.  We reviewed the resident's history and exam and pertinent patient test results.  I agree with the assessment, diagnosis, and plan of care documented in the resident's note.

## 2023-04-26 ENCOUNTER — Other Ambulatory Visit: Payer: Self-pay | Admitting: Internal Medicine

## 2023-04-26 NOTE — Telephone Encounter (Signed)
 Medication sent to pharmacy

## 2023-05-04 ENCOUNTER — Encounter: Admitting: Student

## 2023-05-28 ENCOUNTER — Other Ambulatory Visit: Payer: Self-pay | Admitting: Internal Medicine

## 2023-05-29 NOTE — Telephone Encounter (Signed)
 Prior Authorization for Wegovy  0.25mg  has already been submitted and approved. Please see note from 04/05/23.

## 2023-05-31 ENCOUNTER — Other Ambulatory Visit: Payer: Self-pay | Admitting: Student

## 2023-05-31 NOTE — Telephone Encounter (Signed)
 Copied from CRM [id number]. Topic: Clinical - Medication Refill >> May 31, 2023 10:36 AM Retta Caster wrote: Most Recent Primary Care Visit:  Provider: ATWAY, RAYANN N  Department: [facility]  Visit Type: OPEN ESTABLISHED  Date: 04/04/2023  Medication: Semaglutide -Weight Management 1 MG/0.5ML SOAJ [[id number]]  Has the patient contacted their pharmacy? Yes (Agent: If no, request that the patient contact the pharmacy for the refill. If patient does not wish to contact the pharmacy document the reason why and proceed with request.) (Agent: If yes, when and what did the pharmacy advise?)  Is this the correct pharmacy for this prescription? Yes If no, delete pharmacy and type the correct one.  This is the patient's preferred pharmacy:  [facility] [address], [place] - [address] [place] [address] Phone: [phone] Fax: [phone]   Has the prescription been filled recently? Yes  Is the patient out of the medication? Yes  Has the patient been seen for an appointment in the last year OR does the patient have an upcoming appointment? Yes  Can we respond through MyChart? Yes  Agent: Please be advised that Rx refills may take up to 3 business days. We ask that you follow-up with your pharmacy.

## 2023-06-01 MED ORDER — SEMAGLUTIDE-WEIGHT MANAGEMENT 1 MG/0.5ML ~~LOC~~ SOAJ: 1.0000 mg | SUBCUTANEOUS | 0 refills | Status: DC

## 2023-06-01 NOTE — Telephone Encounter (Signed)
 Dose to be increased as tolerated.

## 2023-06-25 ENCOUNTER — Other Ambulatory Visit: Payer: Self-pay | Admitting: Internal Medicine

## 2023-06-25 ENCOUNTER — Other Ambulatory Visit: Payer: Self-pay | Admitting: Student

## 2023-08-26 ENCOUNTER — Other Ambulatory Visit: Payer: Self-pay | Admitting: Student

## 2023-08-26 DIAGNOSIS — E785 Hyperlipidemia, unspecified: Secondary | ICD-10-CM

## 2023-08-27 NOTE — Telephone Encounter (Signed)
 Schedule follow-up for weight management.  Meds ordered this encounter  Medications   Semaglutide -Weight Management (WEGOVY ) 1 MG/0.5ML SOAJ    Sig: INJECT 1 MG INTO THE SKIN ONCE A WEEK FOR 28 DAYS.    Dispense:  2 mL    Refill:  3   atorvastatin  (LIPITOR) 20 MG tablet    Sig: TAKE 1 TABLET BY MOUTH EVERY DAY    Dispense:  30 tablet    Refill:  11   Ozell Kung MD 08/27/2023, 5:38 PM

## 2023-08-28 NOTE — Telephone Encounter (Signed)
 Appointment has been scheduled.

## 2023-09-06 NOTE — Progress Notes (Deleted)
 Patient name: Carlos Blankenship Date of birth: Nov 22, 1975 Date of visit: 09/06/23  Subjective   Carlos Blankenship is here for a periodic preventive medicine exam.  No new or worsening symptoms. Review of general health and wellness concerns, lifestyle, and risk factors discussed. No Known Allergies Current Outpatient Medications  Medication Instructions   amLODipine  (NORVASC ) 5 mg, Oral, Daily   atorvastatin  (LIPITOR) 20 mg, Oral, Daily   losartan -hydrochlorothiazide  (HYZAAR) 100-25 MG tablet 1 tablet, Oral, Daily   pantoprazole  (PROTONIX ) 40 mg, Oral, Daily   Semaglutide -Weight Management (WEGOVY ) 1 MG/0.5ML SOAJ INJECT 1 MG INTO THE SKIN ONCE A WEEK FOR 28 DAYS.   Past Medical History:  Diagnosis Date   Hypertension    No past surgical history on file. No family history on file. Social History   Socioeconomic History   Marital status: Single    Spouse name: Not on file   Number of children: Not on file   Years of education: Not on file   Highest education level: Not on file  Occupational History   Not on file  Tobacco Use   Smoking status: Every Day    Current packs/day: 0.30    Types: Cigarettes   Smokeless tobacco: Never   Tobacco comments:    1/3/pack per day  Substance and Sexual Activity   Alcohol use: Yes    Comment: occasional   Drug use: No    Types: Marijuana   Sexual activity: Not on file  Other Topics Concern   Not on file  Social History Narrative   Not on file   Social Drivers of Health   Financial Resource Strain: Not on file  Food Insecurity: Not on file  Transportation Needs: Not on file  Physical Activity: Not on file  Stress: Not on file  Social Connections: Unknown (06/13/2021)   [facility] from [facility]   Social Network    Social Network: Not on file  Intimate Partner Violence: Unknown (05/05/2021)   Received from [facility]   HITS    Physically Hurt: Not on file    Insult or Talk Down To: Not on file    Threaten Physical  Harm: Not on file    Scream or Curse: Not on file    Review of Systems  Constitutional:  Negative for fever, malaise/fatigue and weight loss.  HENT:  Negative for hearing loss.   Eyes:        No changes to vision  Respiratory:  Negative for cough and shortness of breath.   Cardiovascular:  Negative for chest pain and palpitations.  Gastrointestinal:  Negative for abdominal pain and blood in stool.       No changes to bowel habits  Genitourinary:  Negative for hematuria.       No changes to urinary habits  Musculoskeletal:  Negative for falls.  Psychiatric/Behavioral:  Negative for depression. The patient does not have insomnia.      Objective  There were no vitals filed for this visit.There is no height or weight on file to calculate BMI.   Physical Exam Constitutional:      General: He is not in acute distress.    Appearance: Normal appearance.  HENT:     Right Ear: Tympanic membrane, ear canal and external ear normal.     Left Ear: Tympanic membrane, ear canal and external ear normal.     Mouth/Throat:     Mouth: Mucous membranes are moist.     Pharynx: No oropharyngeal exudate or posterior  oropharyngeal erythema.  Eyes:     Extraocular Movements: Extraocular movements intact.     Conjunctiva/sclera: Conjunctivae normal.     Pupils: Pupils are equal, round, and reactive to light.  Neck:     Thyroid: No thyroid mass, thyromegaly or thyroid tenderness.     Vascular: No carotid bruit.  Cardiovascular:     Rate and Rhythm: Normal rate and regular rhythm.     Pulses: Normal pulses.     Heart sounds: No murmur heard. Pulmonary:     Effort: Pulmonary effort is normal.     Breath sounds: Normal breath sounds. No wheezing or rales.  Abdominal:     Palpations: Abdomen is soft. There is no hepatomegaly, splenomegaly or mass.     Tenderness: There is no abdominal tenderness.  Musculoskeletal:     Right lower leg: No edema.     Left lower leg: No edema.  Lymphadenopathy:      Cervical: No cervical adenopathy.  Skin:    General: Skin is warm and dry.  Neurological:     Mental Status: He is alert. Mental status is at baseline.     Cranial Nerves: No facial asymmetry.     Motor: No tremor.     Deep Tendon Reflexes: Reflexes normal.  Psychiatric:        Mood and Affect: Mood normal.        Behavior: Behavior normal.       Health Maintenance  Topic Date Due   HIV Screening  Never done   Hepatitis C Screening  Never done   DTaP/Tdap/Td (1 - Tdap) Never done   Pneumococcal Vaccine: 19-49 Years (1 of 2 - PCV) Never done   Pneumococcal Vaccine: 50+ Years (1 of 2 - PCV) Never done   Hepatitis B Vaccines (1 of 3 - 19+ 3-dose series) Never done   Colonoscopy  Never done   COVID-19 Vaccine (3 - 2024-25 season) 10/01/2022   Lung Cancer Screening  Never done   Zoster Vaccines- Shingrix (1 of 2) Never done   INFLUENZA VACCINE  08/31/2023   HPV VACCINES  Aged Out   Meningococcal B Vaccine  Aged Out   Assessment & Plan   There are no diagnoses linked to this encounter.  No follow-ups on file.  Ozell Kung MD 09/06/2023, 12:01 PM

## 2023-09-07 ENCOUNTER — Encounter: Payer: Self-pay | Admitting: Student

## 2023-09-07 ENCOUNTER — Encounter: Admitting: Student

## 2023-12-04 ENCOUNTER — Other Ambulatory Visit: Payer: Self-pay | Admitting: Student

## 2023-12-04 NOTE — Telephone Encounter (Unsigned)
 Copied from CRM [id number]. Topic: Clinical - Medication Refill >> Dec 04, 2023  3:55 PM Fredrica W wrote: Medication:  Semaglutide -Weight Management (WEGOVY ) 1 MG/0.5ML SOAJ  Has the patient contacted their pharmacy? Yes (Agent: If no, request that the patient contact the pharmacy for the refill. If patient does not wish to contact the pharmacy document the reason why and proceed with request.) (Agent: If yes, when and what did the pharmacy advise?) Contact provider  This is the patient's preferred pharmacy:  [id number] [address], [place] - [address] [place] [address] Phone: [phone] Fax: [phone]   Is this the correct pharmacy for this prescription? Yes If no, delete pharmacy and type the correct one.   Has the prescription been filled recently? No July  Is the patient out of the medication? No - next dose do Thursday   Has the patient been seen for an appointment in the last year OR does the patient have an upcoming appointment? Yes March 2025  Can we respond through MyChart? No  Agent: Please be advised that Rx refills may take up to 3 business days. We ask that you follow-up with your pharmacy.

## 2023-12-05 ENCOUNTER — Telehealth: Payer: Self-pay

## 2023-12-05 MED ORDER — WEGOVY 1 MG/0.5ML ~~LOC~~ SOAJ: 1.0000 mg | SUBCUTANEOUS | 3 refills | Status: AC

## 2023-12-05 NOTE — Telephone Encounter (Signed)
 Message sent to PCP and Team.

## 2023-12-05 NOTE — Telephone Encounter (Signed)
 Copied from CRM #[id number]. Topic: Clinical - Prescription Issue >> Dec 05, 2023  8:14 AM Farrel B wrote: Reason for CRM: Pt called in stating that he checked with his pharmacy this morning to see if his prescription for the Semaglutide -Weight Management (WEGOVY ) 1 MG/0.5ML SOAJ was filled as of yet. I informed the patient that I was able to see where the prescription had been submitted at 3:57pm on yesterday and is awaiting pcp's sign off. He stated that he didn't understand. I further explained to him it can take 24-48 hrs on up to 3 business days to have but rest assure I would have someone update him. I offered him the mychart portal as well so that he could view the status. Pt. Carlos Blankenship. [phone]

## 2023-12-12 ENCOUNTER — Telehealth: Payer: Self-pay | Admitting: *Deleted

## 2023-12-12 NOTE — Telephone Encounter (Signed)
 Will forward to [employer].  Copied from CRM #[id number]. Topic: Clinical - Medication Prior Auth >> Dec 12, 2023  9:34 AM Diannia H wrote: Reason for CRM: Patient called and the pharmacy stated that they needed the provider to do a prior auth on his semaglutide -weight management (WEGOVY ) 1 MG/0.5ML SOAJ SQ injection. Patient stated that he has been without his medicine for about a week, he is requesting this be done today.

## 2023-12-12 NOTE — Telephone Encounter (Signed)
 Patient has medicaid, medicaid no longer covers GLP-1. I called the patient I was unable to reach him. I lvm with the following information also lvm for him to schedule a follow up appointment to discuss weight loss options- such as healthy weight and wellness.

## 2023-12-18 ENCOUNTER — Encounter: Payer: Self-pay | Admitting: Student

## 2023-12-20 ENCOUNTER — Telehealth: Payer: Self-pay

## 2023-12-20 NOTE — Telephone Encounter (Signed)
 Call from pt-currently at pharmacy and was informed that is wegovy  still needs a PA Pt also states that he is no longer on medicaid, although chart reflects both medicaid and medicare States it was given to him temporarily when his wife died.   CMA will speak to PA staff to review this request.   CMA also scheduled pt an appt for Mon 11/24 @ 1515 to address weight management options.

## 2023-12-20 NOTE — Telephone Encounter (Signed)
 Prior authorization has been resubmitted with [facility] UMR awaiting approval or denial. Please see my note from today.

## 2023-12-20 NOTE — Telephone Encounter (Signed)
 Prior Authorization for patient (Wegovy  1MG /0.5ML auto-injectors) came through on cover my meds was submitted with last office notes awaiting approval or denial.  XZB:[id number]

## 2023-12-21 ENCOUNTER — Other Ambulatory Visit (HOSPITAL_COMMUNITY): Payer: Self-pay

## 2023-12-21 NOTE — Telephone Encounter (Signed)
 Sorry Carlos Blankenship, correction, the insurance verification is for a managed medicaid plan.   That is why this was denied, and also why the test claim showed that message.  If he has commercial coverage he will need to present his new card AND contact medicaid and let them know!!

## 2023-12-21 NOTE — Telephone Encounter (Addendum)
 YOU ASKED FOR:  Requested Requested Service Description Code 1 Code 2 Plan Dates Amount Wegovy  Inj 1mg  Medicaid 12/20/2023 [place] DENIED: Denied Denied Code 1 Code 2 Plan Service Description Dates Amount Wegovy  Inj 1mg  Medicaid 12/20/2023 [place]  Medicaid does not cover the following service(s) in the [facility] Plan:  Wegovy  Inj 1mg  This requested drug being used for weight loss is not covered under your plan. Please speak with your doctor about your choices  Pa was resubmitted under [facility] UMR. Patient previously had [facility] medicaid under his wife who unfortunately passed away. Patient stated he now has [facility] UMR since last August. I'm thinking that [facility] still has him under medicaid. Camille or Dr.Fox are either one of you able to assist with this?

## 2023-12-21 NOTE — Telephone Encounter (Signed)
 I called and spoke to the patient he is scheduled to come in on Monday. I advised the patient that he can discuss alternative weight loss options during the visit. Also we will need to get his insurance card scanned into his chart.

## 2023-12-21 NOTE — Telephone Encounter (Addendum)
Thank you. Patient is aware.  

## 2023-12-24 ENCOUNTER — Ambulatory Visit: Admitting: Student
